# Patient Record
Sex: Female | Born: 1935 | Race: Black or African American | Hispanic: No | Marital: Married | Smoking: Never smoker
Health system: Southern US, Community
[De-identification: ages and names within clinical notes are randomized; demographics above are authoritative.]

## PROBLEM LIST (undated history)

## (undated) DIAGNOSIS — I639 Cerebral infarction, unspecified: Secondary | ICD-10-CM

## (undated) DIAGNOSIS — I2699 Other pulmonary embolism without acute cor pulmonale: Secondary | ICD-10-CM

## (undated) DIAGNOSIS — N289 Disorder of kidney and ureter, unspecified: Secondary | ICD-10-CM

## (undated) DIAGNOSIS — I10 Essential (primary) hypertension: Secondary | ICD-10-CM

## (undated) DIAGNOSIS — E119 Type 2 diabetes mellitus without complications: Secondary | ICD-10-CM

## (undated) HISTORY — DX: Cerebral infarction, unspecified: I63.9

## (undated) HISTORY — DX: Other pulmonary embolism without acute cor pulmonale: I26.99

---

## 1997-12-15 ENCOUNTER — Other Ambulatory Visit: Admission: RE | Admit: 1997-12-15 | Discharge: 1997-12-15 | Payer: Self-pay | Admitting: Family Medicine

## 1999-11-22 ENCOUNTER — Other Ambulatory Visit: Admission: RE | Admit: 1999-11-22 | Discharge: 1999-11-22 | Payer: Self-pay | Admitting: Family Medicine

## 2000-02-27 ENCOUNTER — Other Ambulatory Visit: Admission: RE | Admit: 2000-02-27 | Discharge: 2000-02-27 | Payer: Self-pay | Admitting: Family Medicine

## 2004-03-27 ENCOUNTER — Ambulatory Visit: Payer: Self-pay | Admitting: *Deleted

## 2004-06-18 ENCOUNTER — Ambulatory Visit: Payer: Self-pay | Admitting: Family Medicine

## 2004-07-22 ENCOUNTER — Ambulatory Visit: Payer: Self-pay | Admitting: Family Medicine

## 2004-07-29 ENCOUNTER — Encounter: Admission: RE | Admit: 2004-07-29 | Discharge: 2004-07-29 | Payer: Self-pay | Admitting: Family Medicine

## 2004-08-27 ENCOUNTER — Ambulatory Visit: Payer: Self-pay | Admitting: Family Medicine

## 2004-09-19 ENCOUNTER — Ambulatory Visit: Payer: Self-pay | Admitting: Family Medicine

## 2005-05-12 ENCOUNTER — Ambulatory Visit: Payer: Self-pay | Admitting: Family Medicine

## 2005-09-01 ENCOUNTER — Ambulatory Visit: Payer: Self-pay | Admitting: Family Medicine

## 2006-01-12 ENCOUNTER — Ambulatory Visit: Payer: Self-pay | Admitting: Family Medicine

## 2006-01-21 ENCOUNTER — Ambulatory Visit: Payer: Self-pay | Admitting: Family Medicine

## 2006-01-23 ENCOUNTER — Ambulatory Visit: Payer: Self-pay | Admitting: Family Medicine

## 2006-02-13 ENCOUNTER — Ambulatory Visit (HOSPITAL_COMMUNITY): Admission: RE | Admit: 2006-02-13 | Discharge: 2006-02-13 | Payer: Self-pay | Admitting: Family Medicine

## 2006-02-13 ENCOUNTER — Encounter (INDEPENDENT_AMBULATORY_CARE_PROVIDER_SITE_OTHER): Payer: Self-pay | Admitting: Family Medicine

## 2006-03-05 ENCOUNTER — Ambulatory Visit: Payer: Self-pay | Admitting: Family Medicine

## 2006-05-06 ENCOUNTER — Ambulatory Visit: Payer: Self-pay | Admitting: Family Medicine

## 2006-11-09 ENCOUNTER — Ambulatory Visit: Payer: Self-pay | Admitting: Family Medicine

## 2006-11-11 ENCOUNTER — Ambulatory Visit: Payer: Self-pay | Admitting: Family Medicine

## 2006-12-03 ENCOUNTER — Ambulatory Visit: Payer: Self-pay | Admitting: Family Medicine

## 2007-03-31 ENCOUNTER — Encounter (INDEPENDENT_AMBULATORY_CARE_PROVIDER_SITE_OTHER): Payer: Self-pay | Admitting: Family Medicine

## 2007-03-31 DIAGNOSIS — E785 Hyperlipidemia, unspecified: Secondary | ICD-10-CM | POA: Insufficient documentation

## 2007-03-31 DIAGNOSIS — Z8739 Personal history of other diseases of the musculoskeletal system and connective tissue: Secondary | ICD-10-CM

## 2007-03-31 DIAGNOSIS — I10 Essential (primary) hypertension: Secondary | ICD-10-CM | POA: Insufficient documentation

## 2007-03-31 DIAGNOSIS — N182 Chronic kidney disease, stage 2 (mild): Secondary | ICD-10-CM | POA: Insufficient documentation

## 2007-05-06 ENCOUNTER — Telehealth (INDEPENDENT_AMBULATORY_CARE_PROVIDER_SITE_OTHER): Payer: Self-pay | Admitting: *Deleted

## 2007-05-20 DIAGNOSIS — M810 Age-related osteoporosis without current pathological fracture: Secondary | ICD-10-CM | POA: Insufficient documentation

## 2007-05-20 DIAGNOSIS — K219 Gastro-esophageal reflux disease without esophagitis: Secondary | ICD-10-CM | POA: Insufficient documentation

## 2007-06-08 ENCOUNTER — Ambulatory Visit: Payer: Self-pay | Admitting: Family Medicine

## 2007-06-08 LAB — CONVERTED CEMR LAB
BUN: 24 mg/dL — ABNORMAL HIGH (ref 6–23)
Basophils Absolute: 0 10*3/uL (ref 0.0–0.1)
Blood Glucose, Fingerstick: 122
CO2: 26 meq/L (ref 19–32)
Cholesterol: 152 mg/dL (ref 0–200)
Creatinine, Ser: 1.59 mg/dL — ABNORMAL HIGH (ref 0.40–1.20)
Eosinophils Relative: 3 % (ref 0–5)
Glucose, Bld: 103 mg/dL — ABNORMAL HIGH (ref 70–99)
HCT: 38.5 % (ref 36.0–46.0)
HDL: 47 mg/dL (ref >39)
Hemoglobin: 12.7 g/dL (ref 12.0–15.0)
Lymphocytes Relative: 28 % (ref 12–46)
Lymphs Abs: 1.7 10*3/uL (ref 0.7–4.0)
Monocytes Absolute: 0.4 10*3/uL (ref 0.1–1.0)
Monocytes Relative: 7 % (ref 3–12)
RDW: 12.2 % (ref 11.5–15.5)
Total Bilirubin: 0.6 mg/dL (ref 0.3–1.2)
Total CHOL/HDL Ratio: 3.2
Triglycerides: 103 mg/dL (ref <150)
VLDL: 21 mg/dL (ref 0–40)

## 2007-07-12 ENCOUNTER — Ambulatory Visit (HOSPITAL_COMMUNITY): Admission: RE | Admit: 2007-07-12 | Discharge: 2007-07-12 | Payer: Self-pay | Admitting: Family Medicine

## 2007-09-08 ENCOUNTER — Telehealth (INDEPENDENT_AMBULATORY_CARE_PROVIDER_SITE_OTHER): Payer: Self-pay | Admitting: *Deleted

## 2007-09-14 ENCOUNTER — Encounter (INDEPENDENT_AMBULATORY_CARE_PROVIDER_SITE_OTHER): Payer: Self-pay | Admitting: *Deleted

## 2007-10-28 ENCOUNTER — Telehealth (INDEPENDENT_AMBULATORY_CARE_PROVIDER_SITE_OTHER): Payer: Self-pay | Admitting: Family Medicine

## 2008-02-15 ENCOUNTER — Encounter (INDEPENDENT_AMBULATORY_CARE_PROVIDER_SITE_OTHER): Payer: Self-pay | Admitting: Family Medicine

## 2008-03-07 ENCOUNTER — Telehealth (INDEPENDENT_AMBULATORY_CARE_PROVIDER_SITE_OTHER): Payer: Self-pay | Admitting: Family Medicine

## 2008-04-05 ENCOUNTER — Encounter (INDEPENDENT_AMBULATORY_CARE_PROVIDER_SITE_OTHER): Payer: Self-pay | Admitting: Family Medicine

## 2008-05-09 ENCOUNTER — Ambulatory Visit: Payer: Self-pay | Admitting: Family Medicine

## 2008-05-09 LAB — CONVERTED CEMR LAB
Bilirubin Urine: NEGATIVE
Blood in Urine, dipstick: NEGATIVE
Ketones, urine, test strip: NEGATIVE
Nitrite: NEGATIVE
Urobilinogen, UA: 0.2
pH: 5

## 2008-05-13 LAB — CONVERTED CEMR LAB
AST: 21 units/L (ref 0–37)
Albumin: 4.4 g/dL (ref 3.5–5.2)
BUN: 39 mg/dL — ABNORMAL HIGH (ref 6–23)
Basophils Relative: 1 % (ref 0–1)
Calcium: 9.3 mg/dL (ref 8.4–10.5)
Chloride: 105 meq/L (ref 96–112)
Cholesterol: 163 mg/dL (ref 0–200)
Creatinine, Ser: 1.96 mg/dL — ABNORMAL HIGH (ref 0.40–1.20)
Glucose, Bld: 107 mg/dL — ABNORMAL HIGH (ref 70–99)
HDL: 54 mg/dL (ref >39)
Hemoglobin: 12.8 g/dL (ref 12.0–15.0)
Lymphs Abs: 2.2 10*3/uL (ref 0.7–4.0)
MCHC: 32.7 g/dL (ref 30.0–36.0)
MCV: 99.7 fL (ref 78.0–100.0)
Monocytes Absolute: 0.5 10*3/uL (ref 0.1–1.0)
Monocytes Relative: 7 % (ref 3–12)
Neutro Abs: 4.2 10*3/uL (ref 1.7–7.7)
Potassium: 4.6 meq/L (ref 3.5–5.3)
RBC: 3.93 M/uL (ref 3.87–5.11)
TSH: 1.321 microintl units/mL (ref 0.350–4.50)
Total CHOL/HDL Ratio: 3
Triglycerides: 117 mg/dL (ref <150)
WBC: 7.3 10*3/uL (ref 4.0–10.5)

## 2008-05-17 ENCOUNTER — Ambulatory Visit (HOSPITAL_COMMUNITY): Admission: RE | Admit: 2008-05-17 | Discharge: 2008-05-17 | Payer: Self-pay | Admitting: Family Medicine

## 2008-05-17 ENCOUNTER — Ambulatory Visit: Payer: Self-pay | Admitting: Family Medicine

## 2008-05-20 ENCOUNTER — Telehealth (INDEPENDENT_AMBULATORY_CARE_PROVIDER_SITE_OTHER): Payer: Self-pay | Admitting: *Deleted

## 2008-05-26 ENCOUNTER — Encounter (INDEPENDENT_AMBULATORY_CARE_PROVIDER_SITE_OTHER): Payer: Self-pay | Admitting: Family Medicine

## 2008-05-26 ENCOUNTER — Telehealth (INDEPENDENT_AMBULATORY_CARE_PROVIDER_SITE_OTHER): Payer: Self-pay | Admitting: Family Medicine

## 2008-06-07 ENCOUNTER — Ambulatory Visit: Payer: Self-pay | Admitting: Family Medicine

## 2008-06-07 LAB — CONVERTED CEMR LAB
BUN: 31 mg/dL — ABNORMAL HIGH (ref 6–23)
Chloride: 105 meq/L (ref 96–112)
Potassium: 4.4 meq/L (ref 3.5–5.3)
Sodium: 141 meq/L (ref 135–145)

## 2008-10-04 ENCOUNTER — Encounter (INDEPENDENT_AMBULATORY_CARE_PROVIDER_SITE_OTHER): Payer: Self-pay | Admitting: Family Medicine

## 2008-10-17 ENCOUNTER — Ambulatory Visit: Payer: Self-pay | Admitting: Family Medicine

## 2008-10-17 LAB — CONVERTED CEMR LAB
Blood Glucose, Fingerstick: 87
Hgb A1c MFr Bld: 6.3 %

## 2008-10-23 ENCOUNTER — Ambulatory Visit: Payer: Self-pay | Admitting: Family Medicine

## 2008-10-26 ENCOUNTER — Ambulatory Visit (HOSPITAL_COMMUNITY): Admission: RE | Admit: 2008-10-26 | Discharge: 2008-10-26 | Payer: Self-pay | Admitting: Family Medicine

## 2008-11-01 ENCOUNTER — Encounter (INDEPENDENT_AMBULATORY_CARE_PROVIDER_SITE_OTHER): Payer: Self-pay | Admitting: Family Medicine

## 2008-11-01 DIAGNOSIS — N2581 Secondary hyperparathyroidism of renal origin: Secondary | ICD-10-CM | POA: Insufficient documentation

## 2008-11-24 ENCOUNTER — Encounter (INDEPENDENT_AMBULATORY_CARE_PROVIDER_SITE_OTHER): Payer: Self-pay | Admitting: Family Medicine

## 2009-01-19 ENCOUNTER — Encounter (INDEPENDENT_AMBULATORY_CARE_PROVIDER_SITE_OTHER): Payer: Self-pay | Admitting: Nurse Practitioner

## 2009-01-31 ENCOUNTER — Encounter (INDEPENDENT_AMBULATORY_CARE_PROVIDER_SITE_OTHER): Payer: Self-pay | Admitting: Nurse Practitioner

## 2009-01-31 DIAGNOSIS — I129 Hypertensive chronic kidney disease with stage 1 through stage 4 chronic kidney disease, or unspecified chronic kidney disease: Secondary | ICD-10-CM

## 2009-05-07 ENCOUNTER — Encounter: Payer: Self-pay | Admitting: Physician Assistant

## 2009-05-07 LAB — CONVERTED CEMR LAB
ALT: 20 units/L
AST: 28 units/L
Chloride: 102 meq/L
GFR calc Af Amer: 37 mL/min
Glucose, Bld: 88 mg/dL
HCT: 34.5 %
Hemoglobin: 12 g/dL
MCV: 97 fL
Monocytes Relative: 6 %
PTH: 24 pg/mL
Platelets: 244 10*3/uL
Potassium: 3.7 meq/L
RDW: 12.8 %
Sodium: 141 meq/L
Total Bilirubin: 0.5 mg/dL
Total Protein: 7.7 g/dL

## 2009-05-09 ENCOUNTER — Ambulatory Visit: Payer: Self-pay | Admitting: Internal Medicine

## 2009-05-09 LAB — CONVERTED CEMR LAB
Cholesterol: 148 mg/dL (ref 0–200)
HDL: 45 mg/dL (ref >39)
Total CHOL/HDL Ratio: 3.3
Triglycerides: 99 mg/dL (ref <150)

## 2009-05-14 ENCOUNTER — Telehealth (INDEPENDENT_AMBULATORY_CARE_PROVIDER_SITE_OTHER): Payer: Self-pay | Admitting: Internal Medicine

## 2009-05-27 ENCOUNTER — Encounter: Payer: Self-pay | Admitting: Physician Assistant

## 2009-06-06 ENCOUNTER — Ambulatory Visit: Payer: Self-pay | Admitting: Physician Assistant

## 2009-06-06 DIAGNOSIS — E119 Type 2 diabetes mellitus without complications: Secondary | ICD-10-CM | POA: Insufficient documentation

## 2009-06-06 LAB — CONVERTED CEMR LAB
HDL: 42 mg/dL (ref >39)
LDL Cholesterol: 82 mg/dL (ref 0–99)
Triglycerides: 106 mg/dL (ref <150)
VLDL: 21 mg/dL (ref 0–40)

## 2009-06-11 ENCOUNTER — Encounter: Payer: Self-pay | Admitting: Physician Assistant

## 2009-09-04 ENCOUNTER — Ambulatory Visit: Payer: Self-pay | Admitting: Physician Assistant

## 2009-09-04 LAB — CONVERTED CEMR LAB
Alkaline Phosphatase: 82 units/L (ref 39–117)
BUN: 29 mg/dL — ABNORMAL HIGH (ref 6–23)
Blood Glucose, Fingerstick: 86
Creatinine, Ser: 1.55 mg/dL — ABNORMAL HIGH (ref 0.40–1.20)
Glucose, Bld: 88 mg/dL (ref 70–99)
Total Bilirubin: 0.6 mg/dL (ref 0.3–1.2)

## 2009-09-06 ENCOUNTER — Encounter: Payer: Self-pay | Admitting: Physician Assistant

## 2009-10-09 ENCOUNTER — Encounter: Payer: Self-pay | Admitting: Physician Assistant

## 2009-10-09 ENCOUNTER — Ambulatory Visit: Payer: Self-pay | Admitting: Internal Medicine

## 2009-10-09 DIAGNOSIS — L659 Nonscarring hair loss, unspecified: Secondary | ICD-10-CM | POA: Insufficient documentation

## 2009-10-09 LAB — CONVERTED CEMR LAB: TSH: 1.276 microintl units/mL (ref 0.350–4.500)

## 2009-10-10 ENCOUNTER — Encounter: Payer: Self-pay | Admitting: Physician Assistant

## 2009-10-17 ENCOUNTER — Encounter: Payer: Self-pay | Admitting: Physician Assistant

## 2009-10-28 ENCOUNTER — Encounter: Payer: Self-pay | Admitting: Physician Assistant

## 2010-01-01 ENCOUNTER — Telehealth: Payer: Self-pay | Admitting: Physician Assistant

## 2010-03-01 ENCOUNTER — Ambulatory Visit: Payer: Self-pay | Admitting: Physician Assistant

## 2010-03-01 LAB — CONVERTED CEMR LAB
Blood Glucose, Fingerstick: 105
Hgb A1c MFr Bld: 6.3 %

## 2010-03-03 LAB — CONVERTED CEMR LAB
ALT: 18 units/L (ref 0–35)
AST: 25 units/L (ref 0–37)
Albumin: 4.2 g/dL (ref 3.5–5.2)
Alkaline Phosphatase: 77 units/L (ref 39–117)
BUN: 39 mg/dL — ABNORMAL HIGH (ref 6–23)
Potassium: 4.6 meq/L (ref 3.5–5.3)
Sodium: 143 meq/L (ref 135–145)
Total Protein: 7.1 g/dL (ref 6.0–8.3)

## 2010-03-07 ENCOUNTER — Ambulatory Visit (HOSPITAL_COMMUNITY): Admission: RE | Admit: 2010-03-07 | Discharge: 2010-03-07 | Payer: Self-pay | Admitting: Internal Medicine

## 2010-03-07 DIAGNOSIS — R928 Other abnormal and inconclusive findings on diagnostic imaging of breast: Secondary | ICD-10-CM | POA: Insufficient documentation

## 2010-03-13 ENCOUNTER — Encounter: Payer: Self-pay | Admitting: Physician Assistant

## 2010-03-13 ENCOUNTER — Encounter: Admission: RE | Admit: 2010-03-13 | Discharge: 2010-03-13 | Payer: Self-pay | Admitting: Internal Medicine

## 2010-03-20 ENCOUNTER — Ambulatory Visit: Payer: Self-pay | Admitting: Physician Assistant

## 2010-03-21 LAB — CONVERTED CEMR LAB
BUN: 30 mg/dL — ABNORMAL HIGH (ref 6–23)
CO2: 27 meq/L (ref 19–32)
Calcium: 9.6 mg/dL (ref 8.4–10.5)
Chloride: 107 meq/L (ref 96–112)
Creatinine, Ser: 1.76 mg/dL — ABNORMAL HIGH (ref 0.40–1.20)
Glucose, Bld: 106 mg/dL — ABNORMAL HIGH (ref 70–99)
Potassium: 4.9 meq/L (ref 3.5–5.3)
Sodium: 143 meq/L (ref 135–145)

## 2010-04-25 ENCOUNTER — Encounter (INDEPENDENT_AMBULATORY_CARE_PROVIDER_SITE_OTHER): Payer: Self-pay | Admitting: Nurse Practitioner

## 2010-06-25 ENCOUNTER — Ambulatory Visit: Payer: Self-pay | Admitting: Internal Medicine

## 2010-06-25 DIAGNOSIS — R252 Cramp and spasm: Secondary | ICD-10-CM

## 2010-06-25 LAB — CONVERTED CEMR LAB: Blood Glucose, Fingerstick: 126

## 2010-07-09 ENCOUNTER — Ambulatory Visit: Admit: 2010-07-09 | Payer: Self-pay | Admitting: Internal Medicine

## 2010-07-12 ENCOUNTER — Encounter (INDEPENDENT_AMBULATORY_CARE_PROVIDER_SITE_OTHER): Payer: Self-pay | Admitting: Internal Medicine

## 2010-07-12 ENCOUNTER — Ambulatory Visit
Admission: RE | Admit: 2010-07-12 | Discharge: 2010-07-12 | Payer: Self-pay | Source: Home / Self Care | Attending: Internal Medicine | Admitting: Internal Medicine

## 2010-07-12 LAB — CONVERTED CEMR LAB
AST: 24 units/L (ref 0–37)
BUN: 24 mg/dL — ABNORMAL HIGH (ref 6–23)
Calcium: 9.1 mg/dL (ref 8.4–10.5)
Chloride: 105 meq/L (ref 96–112)
Cholesterol: 142 mg/dL (ref 0–200)
Creatinine, Ser: 1.71 mg/dL — ABNORMAL HIGH (ref 0.40–1.20)
HDL: 47 mg/dL (ref >39)
Total CHOL/HDL Ratio: 3

## 2010-07-29 ENCOUNTER — Encounter (INDEPENDENT_AMBULATORY_CARE_PROVIDER_SITE_OTHER): Payer: Self-pay | Admitting: Internal Medicine

## 2010-08-04 LAB — CONVERTED CEMR LAB
Creatinine 24 HR UR: 1019 mg/24hr (ref 700–1800)
Creatinine Clearance: 43 mL/min — ABNORMAL LOW (ref 75–115)
Protein, Ur: 30 mg/24hr — ABNORMAL LOW (ref 50–100)

## 2010-08-08 NOTE — Letter (Signed)
Summary: COLO REPORT  COLO REPORT   Imported By: Arta Bruce 10/30/2009 13:01:22  _____________________________________________________________________  External Attachment:    Type:   Image     Comment:   External Document

## 2010-08-08 NOTE — Letter (Signed)
Summary: *HSN Results Follow up  [facility]  [address] [place], [place] [address]   Phone: [phone]  Fax: [phone]      10/10/2009   Amber Gonzales [address] [place], [place]  [address]   Dear  Ms. [name],                            ____S.Drinkard,FNP   ____D. Gore,FNP       ____B. McPherson,MD   ____V. Rankins,MD    ____E. Mulberry,MD    ____N. Daphine Deutscher, FNP  ____D. Reche Dixon, MD    ____K. Philipp Deputy, MD    __x_S. Alben Spittle, PA-C     This letter is to inform you that your recent test(s):  _______Pap Smear    ___x___Lab Test     _______X-ray    ___x____ is within acceptable limits  _______ requires a medication change  _______ requires a follow-up lab visit  _______ requires a follow-up visit with your provider   Comments:       _________________________________________________________ If you have any questions, please contact our office                     Sincerely,  Tereso Newcomer PA-C [facility]

## 2010-08-08 NOTE — Miscellaneous (Signed)
Summary: Colonoscopy . . . Marland Kitchen needs repeat in 2016  Clinical Lists Changes  Observations: Added new observation of PAST MED HX: Current Problems:  AODM (ICD-250.00) GERD (ICD-530.81) OSTEOPOROSIS (ICD-733.00) SHOULDER PAIN, RIGHT, HX OF (ICD-V13.5) RENAL DISEASE, CHRONIC, MILD (ICD-585.2)   a.  CKD Stage 4 (followed by Dr. Darrick Penna) due to HTN Nephrosclerosis Secondary Hyperparathyroidism HYPERLIPIDEMIA (ICD-272.4) HYPERTENSION (ICD-401.9) Colo 2011: Polyps; bx inconclusive:  Repeat colo in 2016     (10/28/2009 22:29) Added new observation of COLONRECACT: Further recommendations pending biopsy results.  "Fecal Material . . . nondiagnostic"  No colonic mucosa identified Repeat colonoscopy in 5 years.  (10/17/2009 22:31) Added new observation of COLONOSCOPY:  Results: Polyp.  Results: Diverticulosis.        (10/17/2009 22:31)      Colonoscopy  Procedure date:  10/17/2009  Findings:       Results: Polyp.  Results: Diverticulosis.         Comments:      Further recommendations pending biopsy results.  'Fecal Material . . . nondiagnostic'  No colonic mucosa identified Repeat colonoscopy in 5 years.     Past History:  Past Medical History: Current Problems:  AODM (ICD-250.00) GERD (ICD-530.81) OSTEOPOROSIS (ICD-733.00) SHOULDER PAIN, RIGHT, HX OF (ICD-V13.5) RENAL DISEASE, CHRONIC, MILD (ICD-585.2)   a.  CKD Stage 4 (followed by Dr. Darrick Penna) due to HTN Nephrosclerosis Secondary Hyperparathyroidism HYPERLIPIDEMIA (ICD-272.4) HYPERTENSION (ICD-401.9) Colo 2011: Polyps; bx inconclusive:  Repeat colo in 2016

## 2010-08-08 NOTE — Progress Notes (Signed)
Summary: LOVASTATIN REFERRAL   Phone Note Call from Patient   Caller: Patient Reason for Call: Refill Medication Summary of Call: [facility] [place] FOR HER LOVASTATIN 40MG  AND THEY TOLD HER THAT SHE NEEDS TO CALL HER DR .  Initial call taken by: Cheryll Dessert,  January 01, 2010 2:43 PM  Follow-up for Phone Call        forward to provider Follow-up by: Armenia Shannon,  January 01, 2010 3:16 PM  Additional Follow-up for Phone Call Additional follow up Details #1::        pt aware Additional Follow-up by: Armenia Shannon,  January 01, 2010 3:43 PM    Prescriptions: LOVASTATIN 40 MG  TABS (LOVASTATIN) Take 1 tab by mouth at bedtime  #30 x 11   Entered and Authorized by:   Tereso Newcomer PA-C   Signed by:   Tereso Newcomer PA-C on 01/01/2010   Method used:   [facility]  [address] [phone]       [address]       [place], [place]  [address]       Ph: [phone] or [phone]       Fax: [phone]   RxID:   [id number]

## 2010-08-08 NOTE — Assessment & Plan Note (Signed)
Summary: FU IN 3 MONTHS WITH SCOTT FOR DM AND BP//GK   Vital Signs:  Patient profile:   77 year old female Menstrual status:  postmenopausal Height:      62 inches Weight:      163 pounds BMI:     29.92 Temp:     97.6 degrees F oral Pulse rate:   76 / minute Pulse rhythm:   regular Resp:     18 per minute BP sitting:   138 / 78  (left arm) Cuff size:   regular  Vitals Entered By: Armenia Shannon (March 01, 2010 9:20 AM) CC: f/u...., Hypertension Management Is Patient Diabetic? Yes Pain Assessment Patient in pain? no      CBG Result 105  Does patient need assistance? Functional Status Self care Ambulation Normal   Primary Care Provider:  Tereso Newcomer PA-C  CC:  f/u.... and Hypertension Management.  History of Present Illness: Here for f/u. No complaints. Denies any nausea, vomiting or diarrhea. Follows a pretty good diet. Had colo.  Needs mammo.  Needs CPE.  Hypertension History:      She denies headache, chest pain, dyspnea with exertion, peripheral edema, and syncope.  She notes no problems with any antihypertensive medication side effects.        Positive major cardiovascular risk factors include female age 51 years old or older, diabetes, hyperlipidemia, and hypertension.  Negative major cardiovascular risk factors include negative family history for ischemic heart disease and non-tobacco-user status.     Current Medications (verified): 1)  Lovastatin 40 Mg  Tabs (Lovastatin) .... Take 1 Tab By Mouth At Bedtime 2)  Triamterene-Hctz 75-50 Mg  Tabs (Triamterene-Hctz) .... 1/2 Tablet By Mouth Daily For Blood Pressure/fluid 3)  Atenolol 50 Mg  Tabs (Atenolol) .Marland Kitchen.. 1 Tablet By Mouth Daily For Blood Presure 4)  Adult Aspirin Low Strength 81 Mg  Tbdp (Aspirin) .... Take 1 By Mouth Daily 5)  Benazepril Hcl 40 Mg Tabs (Benazepril Hcl) .... Take 1 Tablet By Mouth Two Times A Day (Increased Per Dr.deterding) 6)  Calcitriol 0.5 Mcg Caps (Calcitriol) .... Take 1 Capsule By  Mouth Once A Day(Per Dr.deterding) 7)  Norvasc 5 Mg Tabs (Amlodipine Besylate) .... Take 1 Tablet By Mouth Once A Day For Blood Pressure  Allergies (verified): No Known Drug Allergies  Past History:  Past Medical History: Reviewed history from 10/28/2009 and no changes required. Current Problems:  AODM (ICD-250.00) GERD (ICD-530.81) OSTEOPOROSIS (ICD-733.00) SHOULDER PAIN, RIGHT, HX OF (ICD-V13.5) RENAL DISEASE, CHRONIC, MILD (ICD-585.2)   a.  CKD Stage 4 (followed by Dr. Darrick Penna) due to HTN Nephrosclerosis Secondary Hyperparathyroidism HYPERLIPIDEMIA (ICD-272.4) HYPERTENSION (ICD-401.9) Colo 2011: Polyps; bx inconclusive:  Repeat colo in 2016  Physical Exam  General:  alert, well-developed, and well-nourished.   Head:  normocephalic and atraumatic.   Neck:  supple.   Lungs:  normal breath sounds.   Heart:  normal rate and regular rhythm.   Abdomen:  soft.   Extremities:  no edema  Neurologic:  alert & oriented X3 and cranial nerves II-XII intact.   Psych:  normally interactive.     Impression & Recommendations:  Problem # 1:  DIABETES MELLITUS, TYPE II (ICD-250.00) controlled with diet  Her updated medication list for this problem includes:    Adult Aspirin Low Strength 81 Mg Tbdp (Aspirin) .Marland Kitchen... Take 1 by mouth daily    Benazepril Hcl 40 Mg Tabs (Benazepril hcl) .Marland Kitchen... Take 1 tablet by mouth two times a day (increased per dr.deterding)  Orders:  Capillary Blood Glucose/CBG (82948) Hemoglobin A1C (98119) T-Comprehensive Metabolic Panel ([address]782-95621) T-Urine Microalbumin w/creat. ratio [phone])  Problem # 2:  HYPERTENSION (ICD-401.9) usually well controlled ate Taco Salad last night continue current meds for now  Her updated medication list for this problem includes:    Triamterene-hctz 75-50 Mg Tabs (Triamterene-hctz) .Marland Kitchen... 1/2 tablet by mouth daily for blood pressure/fluid    Atenolol 50 Mg Tabs (Atenolol) .Marland Kitchen... 1 tablet by mouth daily for blood  presure    Benazepril Hcl 40 Mg Tabs (Benazepril hcl) .Marland Kitchen... Take 1 tablet by mouth two times a day (increased per dr.deterding)    Norvasc 5 Mg Tabs (Amlodipine besylate) .Marland Kitchen... Take 1 tablet by mouth once a day for blood pressure  Orders: T-Comprehensive Metabolic Panel (28413-24401)  Problem # 3:  HYPERLIPIDEMIA (ICD-272.4)  Her updated medication list for this problem includes:    Lovastatin 40 Mg Tabs (Lovastatin) .Marland Kitchen... Take 1 tab by mouth at bedtime  Orders: T-Comprehensive Metabolic Panel (02725-36644)  Problem # 4:  PREVENTIVE HEALTH CARE (ICD-V70.0) schedule CPE  Orders: Mammogram (Screening) (Mammo)  Complete Medication List: 1)  Lovastatin 40 Mg Tabs (Lovastatin) .... Take 1 tab by mouth at bedtime 2)  Triamterene-hctz 75-50 Mg Tabs (Triamterene-hctz) .... 1/2 tablet by mouth daily for blood pressure/fluid 3)  Atenolol 50 Mg Tabs (Atenolol) .Marland Kitchen.. 1 tablet by mouth daily for blood presure 4)  Adult Aspirin Low Strength 81 Mg Tbdp (Aspirin) .... Take 1 by mouth daily 5)  Benazepril Hcl 40 Mg Tabs (Benazepril hcl) .... Take 1 tablet by mouth two times a day (increased per dr.deterding) 6)  Calcitriol 0.5 Mcg Caps (Calcitriol) .... Take 1 capsule by mouth once a day(per dr.deterding) 7)  Norvasc 5 Mg Tabs (Amlodipine besylate) .... Take 1 tablet by mouth once a day for blood pressure  Hypertension Assessment/Plan:      The patient's hypertensive risk group is category C: Target organ damage and/or diabetes.  Her calculated 10 year risk of coronary heart disease is 17 %.  Today's blood pressure is 138/78.  Her blood pressure goal is < 125/75.  Patient Instructions: 1)  Please schedule a follow-up appointment in 3 months with Scott for CPE. 2)     Laboratory Results   Blood Tests     HGBA1C: 6.3%   (Normal Range: Non-Diabetic - 3-6%   Control Diabetic - 6-8%) CBG Random:: 105mg /dL

## 2010-08-08 NOTE — Letter (Signed)
Summary: TEST ORDER FORM//MAMMOGRAM  TEST ORDER FORM//MAMMOGRAM   Imported By: Arta Bruce 03/01/2010 10:34:17  _____________________________________________________________________  External Attachment:    Type:   Image     Comment:   External Document

## 2010-08-08 NOTE — Assessment & Plan Note (Signed)
Summary: 2236 PT//3 MO F/U/////RJE//RES.SS   Vital Signs:  Patient profile:   77 year old female Menstrual status:  postmenopausal Weight:      163.31 pounds Temp:     97 degrees F oral Pulse rate:   60 / minute Pulse rhythm:   regular Resp:     16 per minute BP sitting:   144 / 70  (right arm) Cuff size:   large  Vitals Entered By: Hale Drone CMA (June 25, 2010 11:21 AM) CC: 3 month f/u from 03/01/10.  CBG Result 126 CBG Device ID A Non Fasting   Primary Care Provider:  Tereso Newcomer PA-C  CC:  3 month f/u from 03/01/10. Marland Kitchen  History of Present Illness: 1.  DM:  Did get flu vaccine with Dr. Darrick Penna.  A1C today 6.6%.  Sugars generally in low 100s.  Has not had eyes checked this year.  Goes to Dr. ?Sethi for eye exams.  2.  Hypertension:  checks bp at home and always less than when comes here, her bp is up.  Pt. states 126/67 this morning at home.  She states this is generally where she hangs out with her blood pressure at home.    3. Hypercholesterolemia:  has not had checked recently.  Nonfasting today.    4.  Right leg pain:  painful knotting up of right shin when lying down at night.  Rare, but can be very painful.  Worse ofter on feet a lot.    Current Medications (verified): 1)  Lovastatin 40 Mg  Tabs (Lovastatin) .... Take 1 Tab By Mouth At Bedtime 2)  Triamterene-Hctz 75-50 Mg  Tabs (Triamterene-Hctz) .... 1/2 Tablet By Mouth Daily For Blood Pressure/fluid 3)  Atenolol 50 Mg  Tabs (Atenolol) .Marland Kitchen.. 1 Tablet By Mouth Daily For Blood Presure 4)  Adult Aspirin Low Strength 81 Mg  Tbdp (Aspirin) .... Take 1 By Mouth Daily 5)  Benazepril Hcl 40 Mg Tabs (Benazepril Hcl) .... Take 1 Tablet By Mouth Two Times A Day (Increased Per Dr.deterding) 6)  Calcitriol 0.5 Mcg Caps (Calcitriol) .... Take 1 Capsule By Mouth Once A Day(Per Dr.deterding) 7)  Norvasc 5 Mg Tabs (Amlodipine Besylate) .... Take 1 Tablet By Mouth Once A Day For Blood Pressure  Allergies (verified): No Known  Drug Allergies  Physical Exam  General:  NAD Lungs:  Normal respiratory effort, chest expands symmetrically. Lungs are clear to auscultation, no crackles or wheezes. Heart:  Normal rate and regular rhythm. S1 and S2 normal without gallop, murmur, click, rub or other extra sounds.  Radial pulses normal and equal Extremities:  Trace edema, varicosities of LE bilateral.  NT on palpation of pretib area today.   Impression & Recommendations:  Problem # 1:  DIABETES MELLITUS, TYPE II (ICD-250.00) Controlled Encourage to get her yearly eye check Her updated medication list for this problem includes:    Adult Aspirin Low Strength 81 Mg Tbdp (Aspirin) .Marland Kitchen... Take 1 by mouth daily    Benazepril Hcl 40 Mg Tabs (Benazepril hcl) .Marland Kitchen... Take 1 tablet by mouth two times a day (increased per dr.deterding)  Orders: Capillary Blood Glucose/CBG (82948) Hgb A1C ([address]UE)  Problem # 2:  HYPERTENSION (ICD-401.9) Pt. gives hx of good bps at home--have her bring in cuff at next visit and keep list of bps done at home. Her updated medication list for this problem includes:    Furosemide 20 Mg Tabs (Furosemide) .Marland Kitchen... 1 tab by mouth daily in morning    Atenolol 50 Mg  Tabs (Atenolol) .Marland Kitchen... 1 tablet by mouth daily for blood presure    Benazepril Hcl 40 Mg Tabs (Benazepril hcl) .Marland Kitchen... Take 1 tablet by mouth two times a day (increased per dr.deterding)    Norvasc 5 Mg Tabs (Amlodipine besylate) .Marland Kitchen... Take 1 tablet by mouth once a day for blood pressure  Problem # 3:  HYPERLIPIDEMIA (ICD-272.4) Nonfasting today--return for blood work Her updated medication list for this problem includes:    Lovastatin 40 Mg Tabs (Lovastatin) .Marland Kitchen... Take 1 tab by mouth at bedtime  Problem # 4:  LEG CRAMPS (ICD-729.82) Stop Dyazide--switch to Furosemide and see if helps To drink more water Recheck Electrolytes and bp in 2 weeks with labs  Complete Medication List: 1)  Lovastatin 40 Mg Tabs (Lovastatin) .... Take 1 tab by mouth  at bedtime 2)  Furosemide 20 Mg Tabs (Furosemide) .Marland Kitchen.. 1 tab by mouth daily in morning 3)  Atenolol 50 Mg Tabs (Atenolol) .Marland Kitchen.. 1 tablet by mouth daily for blood presure 4)  Adult Aspirin Low Strength 81 Mg Tbdp (Aspirin) .... Take 1 by mouth daily 5)  Benazepril Hcl 40 Mg Tabs (Benazepril hcl) .... Take 1 tablet by mouth two times a day (increased per dr.deterding) 6)  Calcitriol 0.5 Mcg Caps (Calcitriol) .... Take 1 capsule by mouth once a day(per dr.deterding) 7)  Norvasc 5 Mg Tabs (Amlodipine besylate) .... Take 1 tablet by mouth once a day for blood pressure  Patient Instructions: 1)  Schedule for FLP, CMET inn 2 weeks with bp checks 2)  Follow up with Dr. Delrae Alfred in 3 months -bp and muscle cramping 3)  . Prescriptions: NORVASC 5 MG TABS (AMLODIPINE BESYLATE) Take 1 tablet by mouth once a day for blood pressure  #30 x 11   Entered and Authorized by:   Julieanne Manson MD   Signed by:   Julieanne Manson MD on 06/25/2010   Method used:   [facility]        [facility] [phone]* (retail)       [address]       [place], [place]  [address]       Ph: [phone] or [phone]       Fax: [phone]   RxID:   [id number] BENAZEPRIL HCL 40 MG TABS (BENAZEPRIL HCL) Take 1 tablet by mouth two times a day (increased per Dr.Deterding)  #60 x 11   Entered and Authorized by:   Julieanne Manson MD   Signed by:   Julieanne Manson MD on 06/25/2010   Method used:   [facility] to        [facility] [address] [phone]* (retail)       [address]       [place], [place]  [address]       Ph: [phone] or [phone]       Fax: [phone]   RxID:   [id number] ATENOLOL 50 MG  TABS (ATENOLOL) 1 tablet by mouth daily for blood presure  #30 Tablet x 11   Entered and Authorized by:   Julieanne Manson MD   Signed by:   Julieanne Manson MD on 06/25/2010   Method used:   [facility] to        [facility]   [address] [phone]* (retail)       [address]       [place], [place]  [address]       Ph: [phone] or [phone]       Fax: [phone]   RxID:   [id number] LOVASTATIN 40 MG  TABS (LOVASTATIN) Take 1 tab by mouth at bedtime  #30 x 11   Entered and Authorized by:   Julieanne Manson MD   Signed by:   Julieanne Manson MD on 06/25/2010   Method used:   Elect[facility]ically to        [facility] [address] [phone]* (retail)       [address]       [place], [place]  [address]       Ph: [phone] or [phone]       Fax: [phone]   RxID:   [id number] FUROSEMIDE 20 MG TABS (FUROSEMIDE) 1 tab by mouth daily in morning  #30 x 11   Entered and Authorized by:   Julieanne Manson MD   Signed by:   Julieanne Manson MD on 06/25/2010   Method used:   [facility]ronically to        [facility] [address] [phone]* (retail)       [address]       [place], [place]  [address]       Ph: [phone] or [phone]       Fax: [phone]   RxID:   [id number]    Orders Added: 1)  Capillary Blood Glucose/CBG [82948] 2)  Hgb A1C [83036QW] 3)  Est. Patient Level IV [73419]   Immunization History:  Influenza Immunization History:    Influenza:  fluvax 3+ (03/07/2010)   Immunization History:  Influenza Immunization History:    Influenza:  Fluvax 3+ (03/07/2010)   Laboratory Results   Blood Tests   Date/Time Received: June 25, 2010 11:44 AM   HGBA1C: 6.6%   (Normal Range: Non-Diabetic - 3-6%   Control Diabetic - 6-8%) CBG Random:: 126mg /dL

## 2010-08-08 NOTE — Assessment & Plan Note (Signed)
Summary: bp check cmet, and flp///cns  Nurse Visit   Vital Signs:  Patient profile:   77 year old female Menstrual status:  postmenopausal Pulse rate:   52 / minute BP sitting:   140 / 76  (right arm) Cuff size:   regular  Vitals Entered By: Gaylyn Cheers RN (July 12, 2010 10:18 AM)  Primary Care Provider:  Tereso Newcomer PA-C   History of Present Illness: Denies SOB, CP, dizziness no ankle swelling, no leg cramps. Taking all meds as prescribed with no problems. Pt. brought her BP machine in to be checked BP 171/86 P 52. Discussed changing batteries, if still having problems may need to replace has had for a long time. Discussed BP with Dr. Delrae Alfred.    Patient Instructions: 1)  Continue all medications as prescribed 2)  Changes in BP notifiy office, may have checked @ Fire Dept. or Drug store. 3)  Keep scheduled appointment in March   Allergies: No Known Drug Allergies  Orders Added: 1)  Est. Patient Level I [[address]] 2)  T-Comprehensive Metabolic Panel [80053-22900] 3)  T-Lipid Profile [60454-09811]

## 2010-08-08 NOTE — Letter (Signed)
Summary: [facility]/PATIENT NOTE  [facility]/PATIENT NOTE   Imported By: Arta Bruce 07/12/2009 15:52:14  _____________________________________________________________________  External Attachment:    Type:   Image     Comment:   External Document

## 2010-08-08 NOTE — Letter (Signed)
Summary: [facility]  [facility]   Imported By: Arta Bruce 06/06/2010 09:00:18  _____________________________________________________________________  External Attachment:    Type:   Image     Comment:   External Document

## 2010-08-08 NOTE — Assessment & Plan Note (Signed)
Summary: 21month f/u////cns   Vital Signs:  Patient profile:   77 year old female Menstrual status:  postmenopausal Height:      62 inches Weight:      163 pounds Temp:     97.6 degrees F oral Pulse rate:   53 / minute Pulse rhythm:   regular Resp:     18 per minute BP sitting:   148 / 81  (left arm) Cuff size:   regular  Vitals Entered By: Armenia Shannon (September 04, 2009 11:44 AM) CC: three month f/u....., Hypertension Management Is Patient Diabetic? Yes Pain Assessment Patient in pain? no      CBG Result 86  Does patient need assistance? Functional Status Self care Ambulation Normal   CC:  three month f/u..... and Hypertension Management.  History of Present Illness: Here for f/u.  CKD:  Still seeing Dr. Darrick Penna.  No AV fistula.  No c/o nausea or confusion.  HTN:  Stil taking meds.  See below.  Had some left shoulder pain a couple weeks ago while doing housework.  Lasted about 5 mins.  No substernal chest pain.  No assoc sob or nausea or diaph.  No jaw pain.  She has some shoulder pain when she wakes up in the am.  She changes position of her arm and it feels better.  No exertional chest pain or sob.  No syncope.  No orhtopnea, pnd or edema.  DM:  Sugars at home 120s.  Still following diet only.  Has eye dr.  No foot problems.  Hypertension History:      She denies headache, dyspnea with exertion, orthopnea, PND, peripheral edema, and syncope.  She notes no problems with any antihypertensive medication side effects.        Positive major cardiovascular risk factors include female age 75 years old or older, diabetes, hyperlipidemia, and hypertension.  Negative major cardiovascular risk factors include negative family history for ischemic heart disease and non-tobacco-user status.     Problems Prior to Update: 1)  Preventive Health Care  (ICD-V70.0) 2)  Nephrosclerosis  (ICD-403.90) 3)  Secondary Hyperparathyroidism  (ICD-588.81) 4)  Screening Mammography, Not High  Risk  (ICD-V76.12) 5)  Diabetes Mellitus, Type II  (ICD-250.00) 6)  Gerd  (ICD-530.81) 7)  Osteoporosis  (ICD-733.00) 8)  Shoulder Pain, Right, Hx of  (ICD-V13.5) 9)  Renal Disease, Chronic, Mild  (ICD-585.2) 10)  Hyperlipidemia  (ICD-272.4) 11)  Hypertension  (ICD-401.9)  Current Medications (verified): 1)  Lovastatin 40 Mg  Tabs (Lovastatin) .... Take 1 Tab By Mouth At Bedtime 2)  Triamterene-Hctz 75-50 Mg  Tabs (Triamterene-Hctz) .... 1/2 Tablet By Mouth Daily For Blood Pressure/fluid 3)  Atenolol 50 Mg  Tabs (Atenolol) .Marland Kitchen.. 1 Tablet By Mouth Daily For Blood Presure 4)  Adult Aspirin Low Strength 81 Mg  Tbdp (Aspirin) .... Take 1 By Mouth Daily 5)  Benazepril Hcl 40 Mg Tabs (Benazepril Hcl) .... Take 1 Tablet By Mouth Two Times A Day (Increased Per Dr.deterding) 6)  Calcitriol 0.5 Mcg Caps (Calcitriol) .... Take 1 Capsule By Mouth Once A Day(Per Dr.deterding)  Allergies (verified): No Known Drug Allergies  Past History:  Past Medical History: Last updated: 05/27/2009 Current Problems:  AODM (ICD-250.00) GERD (ICD-530.81) OSTEOPOROSIS (ICD-733.00) SHOULDER PAIN, RIGHT, HX OF (ICD-V13.5) RENAL DISEASE, CHRONIC, MILD (ICD-585.2)   a.  CKD Stage 4 (followed by Dr. Darrick Penna) due to HTN Nephrosclerosis Secondary Hyperparathyroidism HYPERLIPIDEMIA (ICD-272.4) HYPERTENSION (ICD-401.9)  Physical Exam  General:  alert, well-developed, and well-nourished.   Head:  normocephalic and atraumatic.   Neck:  supple.   Lungs:  normal breath sounds.   Heart:  normal rate and regular rhythm.   Abdomen:  soft.   Msk:  no foot callus or ulcer bilat  Pulses:  DP/PT 2+ bilat  Extremities:  no edema  Neurologic:  alert & oriented X3 and cranial nerves II-XII intact.   Psych:  normally interactive.     Impression & Recommendations:  Problem # 1:  HYPERLIPIDEMIA (ICD-272.4)  LDL < 100 in Dec check cmet  Her updated medication list for this problem includes:    Lovastatin 40 Mg  Tabs (Lovastatin) .Marland Kitchen... Take 1 tab by mouth at bedtime  Orders: T-Comprehensive Metabolic Panel ([id number])  Problem # 2:  DIABETES MELLITUS, TYPE II (ICD-250.00)  Her updated medication list for this problem includes:    Adult Aspirin Low Strength 81 Mg Tbdp (Aspirin) .Marland Kitchen... Take 1 by mouth daily    Benazepril Hcl 40 Mg Tabs (Benazepril hcl) .Marland Kitchen... Take 1 tablet by mouth two times a day (increased per dr.deterding)  Orders: T-Comprehensive Metabolic Panel ([id number])  Problem # 3:  HYPERTENSION (ICD-401.9) uncontrolled start norvasc 5 f/u with me in one month  Her updated medication list for this problem includes:    Triamterene-hctz 75-50 Mg Tabs (Triamterene-hctz) .Marland Kitchen... 1/2 tablet by mouth daily for blood pressure/fluid    Atenolol 50 Mg Tabs (Atenolol) .Marland Kitchen... 1 tablet by mouth daily for blood presure    Benazepril Hcl 40 Mg Tabs (Benazepril hcl) .Marland Kitchen... Take 1 tablet by mouth two times a day (increased per dr.deterding)    Norvasc 5 Mg Tabs (Amlodipine besylate) .Marland Kitchen... Take 1 tablet by mouth once a day for blood pressure  Orders: T-Comprehensive Metabolic Panel ([id number])  Problem # 4:  NEPHROSCLEROSIS (ICD-403.90) f/u with nephrology  Her updated medication list for this problem includes:    Triamterene-hctz 75-50 Mg Tabs (Triamterene-hctz) .Marland Kitchen... 1/2 tablet by mouth daily for blood pressure/fluid    Atenolol 50 Mg Tabs (Atenolol) .Marland Kitchen... 1 tablet by mouth daily for blood presure    Benazepril Hcl 40 Mg Tabs (Benazepril hcl) .Marland Kitchen... Take 1 tablet by mouth two times a day (increased per dr.deterding)    Norvasc 5 Mg Tabs (Amlodipine besylate) .Marland Kitchen... Take 1 tablet by mouth once a day for blood pressure  Problem # 5:  Preventive Health Care (ICD-V70.0)  no h/o colo . . . patient would like to schedule now vaccines up to date mammo due in April she had osteopenia on DEXA in 05/2008; cannot find where Dr. Barbaraann Barthel d/w her (recheck later this year)  Orders: Gastroenterology  Referral (GI)  Complete Medication List: 1)  Lovastatin 40 Mg Tabs (Lovastatin) .... Take 1 tab by mouth at bedtime 2)  Triamterene-hctz 75-50 Mg Tabs (Triamterene-hctz) .... 1/2 tablet by mouth daily for blood pressure/fluid 3)  Atenolol 50 Mg Tabs (Atenolol) .Marland Kitchen.. 1 tablet by mouth daily for blood presure 4)  Adult Aspirin Low Strength 81 Mg Tbdp (Aspirin) .... Take 1 by mouth daily 5)  Benazepril Hcl 40 Mg Tabs (Benazepril hcl) .... Take 1 tablet by mouth two times a day (increased per dr.deterding) 6)  Calcitriol 0.5 Mcg Caps (Calcitriol) .... Take 1 capsule by mouth once a day(per dr.deterding) 7)  Norvasc 5 Mg Tabs (Amlodipine besylate) .... Take 1 tablet by mouth once a day for blood pressure  Hypertension Assessment/Plan:      The patient's hypertensive risk group is category C: Target organ damage and/or diabetes.  Her calculated 10 year  risk of coronary heart disease is 24 %.  Today's blood pressure is 148/81.  Her blood pressure goal is < 125/75.  Patient Instructions: 1)  Please schedule a follow-up appointment in 1 month with Anwen Cannedy for blood pressure. 2)    Prescriptions: NORVASC 5 MG TABS (AMLODIPINE BESYLATE) Take 1 tablet by mouth once a day for blood pressure  #30 x 5   Entered and Authorized by:   Tereso Newcomer PA-C   Signed by:   Tereso Newcomer PA-C on 09/04/2009   Method used:   Print then Give to Patient   RxID:   [id number]   Laboratory Results   Blood Tests     HGBA1C: 6.1%   (Normal Range: Non-Diabetic - 3-6%   Control Diabetic - 6-8%) CBG Random:: 86mg /dL

## 2010-08-08 NOTE — Letter (Signed)
Summary: Lipid Letter  [facility]  [address] [place], [place] [address]   Phone: [phone]  Fax: [phone]    07/29/2010  Amber Gonzales [address] [place], [place]  [address]  Dear Amber Gonzales:  We have carefully reviewed your last lipid profile from 07/12/2010 and the results are noted below with a summary of recommendations for lipid management.    Cholesterol:       142     Goal: <200   HDL "good" Cholesterol:   47     Goal: >45   LDL "bad" Cholesterol:   74     Goal: <70   Triglycerides:       103     Goal: <150    Cholesterol is at goal.  Your kidney function was stable and rest of chemistries were fine.    TLC Diet (Therapeutic Lifestyle Change): Saturated Fats & Transfatty acids should be kept < 7% of total calories ***Reduce Saturated Fats Polyunstaurated Fat can be up to 10% of total calories Monounsaturated Fat Fat can be up to 20% of total calories Total Fat should be no greater than 25-35% of total calories Carbohydrates should be 50-60% of total calories Protein should be approximately 15% of total calories Fiber should be at least 20-30 grams a day ***Increased fiber may help lower LDL Total Cholesterol should be < 200mg /day Consider adding plant stanol/sterols to diet (example: Benacol spread) ***A higher intake of unsaturated fat may reduce Triglycerides and Increase HDL    Adjunctive Measures (may lower LIPIDS and reduce risk of Heart Attack) include: Aerobic Exercise (20-30 minutes 3-4 times a week) Limit Alcohol Consumption Weight Reduction Aspirin 75-81 mg a day by mouth (if not allergic or contraindicated) Dietary Fiber 20-30 grams a day by mouth     Current Medications: 1)    Lovastatin 40 Mg  Tabs (Lovastatin) .... Take 1 tab by mouth at bedtime 2)    Furosemide 20 Mg Tabs (Furosemide) .Marland Kitchen.. 1 tab by mouth daily in morning 3)    Atenolol 50 Mg  Tabs (Atenolol) .Marland Kitchen.. 1 tablet by mouth daily for blood  presure 4)    Adult Aspirin Low Strength 81 Mg  Tbdp (Aspirin) .... Take 1 by mouth daily 5)    Benazepril Hcl 40 Mg Tabs (Benazepril hcl) .... Take 1 tablet by mouth two times a day (increased per dr.deterding) 6)    Calcitriol 0.5 Mcg Caps (Calcitriol) .... Take 1 capsule by mouth once a day(per dr.deterding) 7)    Norvasc 5 Mg Tabs (Amlodipine besylate) .... Take 1 tablet by mouth once a day for blood pressure  If you have any questions, please call. We appreciate being able to work with you.   Sincerely,    [facility] Tereso Newcomer PA-C

## 2010-08-08 NOTE — Letter (Signed)
Summary: *HSN Results Follow up  [facility]  [address] [place], [place] [address]   Phone: [phone]  Fax: [phone]      09/06/2009   Brittany JAMAYA SLEETH [address] [place], [place]  [address]   Dear  Ms. Calia Barbary,                            ____S.Drinkard,FNP   ____D. Gore,FNP       ____B. McPherson,MD   ____V. Rankins,MD    ____E. Mulberry,MD    ____N. Daphine Deutscher, FNP  ____D. Reche Dixon, MD    ____K. Philipp Deputy, MD    __x__S. Alben Spittle, PA-C     This letter is to inform you that your recent test(s):  _______Pap Smear    ___x____Lab Test     _______X-ray    ___x____ is within acceptable limits  _______ requires a medication change  _______ requires a follow-up lab visit  _______ requires a follow-up visit with your provider   Comments:  Kidney function is stable.  Liver function is normal.       _________________________________________________________ If you have any questions, please contact our office                     Sincerely,  Tereso Newcomer PA-C [facility]

## 2010-08-08 NOTE — Assessment & Plan Note (Signed)
Summary: HTN//mm   Vital Signs:  Patient profile:   77 year old female Menstrual status:  postmenopausal Height:      62 inches Weight:      165 pounds BMI:     30.29 Temp:     98.0 degrees F oral Pulse rate:   59 / minute Pulse rhythm:   regular Resp:     18 per minute BP sitting:   118 / 70  (left arm) Cuff size:   regular  Vitals Entered By: Arm[place] Shannon (October 09, 2009 9:40 AM) CC: HTN.... pt says she has been doing good since she started amlodipine..., Hypertension Management Is Patient Diabetic? Yes Pain Assessment Patient in pain? no      CBG Result 117  Does patient need assistance? Functional Status Self care Ambulation Normal   Primary Care Provider:  Tereso Newcomer PA-C  CC:  HTN.... pt says she has been doing good since she started amlodipine... and Hypertension Management.  History of Present Illness: Here for f/u on BP. Doing well with amlodipine. No side effects.  No edema now.  Did have a little swelling at first. Has checked BP at home and readings very similar to today.  Health maint: Colo scheduled for this month. Pt. has card to set up mammo appt. All vaccines up to date. Pt deferred pap in 2009. . . she is over 40 and no longer needs (no h/o cervical cancer)  Does note hair loss.  Has noted for a year.  She keeps losing more and more.    Hypertension History:      She denies chest pain, dyspnea with exertion, and syncope.        Positive major cardiovascular risk factors include female age 41 years old or older, diabetes, hyperlipidemia, and hypertension.  Negative major cardiovascular risk factors include negative family history for ischemic heart disease and non-tobacco-user status.     Current Medications (verified): 1)  Lovastatin 40 Mg  Tabs (Lovastatin) .... Take 1 Tab By Mouth At Bedtime 2)  Triamterene-Hctz 75-50 Mg  Tabs (Triamterene-Hctz) .... 1/2 Tablet By Mouth Daily For Blood Pressure/fluid 3)  Atenolol 50 Mg  Tabs (Atenolol)  .Marland Kitchen.. 1 Tablet By Mouth Daily For Blood Presure 4)  Adult Aspirin Low Strength 81 Mg  Tbdp (Aspirin) .... Take 1 By Mouth Daily 5)  Benazepril Hcl 40 Mg Tabs (Benazepril Hcl) .... Take 1 Tablet By Mouth Two Times A Day (Increased Per Dr.deterding) 6)  Calcitriol 0.5 Mcg Caps (Calcitriol) .... Take 1 Capsule By Mouth Once A Day(Per Dr.deterding) 7)  Norvasc 5 Mg Tabs (Amlodipine Besylate) .... Take 1 Tablet By Mouth Once A Day For Blood Pressure  Allergies (verified): No Known Drug Allergies  Physical Exam  General:  alert, well-developed, and well-nourished.   Head:  normocephalic and atraumatic.   Hair loss from front to back - distribution like female pattern baldness Lungs:  normal breath sounds.   Heart:  normal rate and regular rhythm.   Abdomen:  soft.   Extremities:  no edema  Neurologic:  alert & oriented X3 and cranial nerves II-XII intact.   Psych:  normally interactive.    Diabetes Management Exam:    Foot Exam (with socks and/or shoes not present):       Sensory-Monofilament:          Left foot: normal          Right foot: normal   Impression & Recommendations:  Problem # 1:  DIABETES  MELLITUS, TYPE II (ICD-250.00) monofilament ok today Has eye doctor and gets annual exams (Dr. Pearlean Brownie  ??)  Her updated medication list for this problem includes:    Adult Aspirin Low Strength 81 Mg Tbdp (Aspirin) .Marland Kitchen... Take 1 by mouth daily    Benazepril Hcl 40 Mg Tabs (Benazepril hcl) .Marland Kitchen... Take 1 tablet by mouth two times a day (increased per dr.deterding)  Problem # 2:  HYPERTENSION (ICD-401.9) better controlled cont current mgmt  Her updated medication list for this problem includes:    Triamterene-hctz 75-50 Mg Tabs (Triamterene-hctz) .Marland Kitchen... 1/2 tablet by mouth daily for blood pressure/fluid    Atenolol 50 Mg Tabs (Atenolol) .Marland Kitchen... 1 tablet by mouth daily for blood presure    Benazepril Hcl 40 Mg Tabs (Benazepril hcl) .Marland Kitchen... Take 1 tablet by mouth two times a day (increased per  dr.deterding)    Norvasc 5 Mg Tabs (Amlodipine besylate) .Marland Kitchen... Take 1 tablet by mouth once a day for blood pressure  Problem # 3:  HAIR LOSS (ICD-704.00)  check TSH checked meds. . . .none cause alopecia  Orders: T-TSH ([id number])  Problem # 4:  PREVENTIVE HEALTH CARE (ICD-V70.0) colo this month pt to sched mammo does not need paps anymore schedule cpe in 05/2010 (d/w patient DEXA at that time)  Complete Medication List: 1)  Lovastatin 40 Mg Tabs (Lovastatin) .... Take 1 tab by mouth at bedtime 2)  Triamterene-hctz 75-50 Mg Tabs (Triamterene-hctz) .... 1/2 tablet by mouth daily for blood pressure/fluid 3)  Atenolol 50 Mg Tabs (Atenolol) .Marland Kitchen.. 1 tablet by mouth daily for blood presure 4)  Adult Aspirin Low Strength 81 Mg Tbdp (Aspirin) .... Take 1 by mouth daily 5)  Benazepril Hcl 40 Mg Tabs (Benazepril hcl) .... Take 1 tablet by mouth two times a day (increased per dr.deterding) 6)  Calcitriol 0.5 Mcg Caps (Calcitriol) .... Take 1 capsule by mouth once a day(per dr.deterding) 7)  Norvasc 5 Mg Tabs (Amlodipine besylate) .... Take 1 tablet by mouth once a day for blood pressure  Hypertension Assessment/Plan:      The patient's hypertensive risk group is category C: Target organ damage and/or diabetes.  Her calculated 10 year risk of coronary heart disease is 11 %.  Today's blood pressure is 118/70.  Her blood pressure goal is < 125/75.  Patient Instructions: 1)  Please schedule a follow-up appointment in 3 months with Mkenzie Dotts for diabetes and blood pressure. 2)  Schedule CPE with Lorin Picket in November 2011. 3)     Last LDL:                                                 82 (06/06/2009 9:40:00 PM)        Diabetic Foot Exam Last Podiatry Exam Date: 10/09/2009  Foot Inspection Is there a history of a foot ulcer?              No Is there a foot ulcer now?              No Can the patient see the bottom of their feet?          Yes Are the shoes appropriate in style and fit?           Yes Is there swelling or an abnormal foot shape?          No Are the  toenails long?                No Are the toenails thick?                No Are the toenails ingrown?              No Is there heavy callous build-up?              No Is there a claw toe deformity?                          No Is there elevated skin temperature?            No Is there limited ankle dorsiflexion?            No Is there foot or ankle muscle weakness?            No Do you have pain in calf while walking?           No      Diabetic Foot Care Education :Patient educated on appropriate care of diabetic feet.     10-g (5.07) Semmes-Weinstein Monofilament Test Performed by: Armenia Shannon          Right Foot          Left Foot Visual Inspection               Test Control      normal         normal Site 1         normal         normal Site 2         normal         normal Site 3         normal         normal Site 4         normal         normal Site 5         normal         normal Site 6         normal         normal Site 7         normal         normal Site 8         normal         normal Site 9         abnormal         abnormal Site 10         normal         normal  Impression      normal         normal

## 2010-08-30 ENCOUNTER — Encounter (INDEPENDENT_AMBULATORY_CARE_PROVIDER_SITE_OTHER): Payer: Self-pay | Admitting: Internal Medicine

## 2010-09-06 ENCOUNTER — Emergency Department (HOSPITAL_COMMUNITY): Payer: Medicare Other

## 2010-09-06 ENCOUNTER — Emergency Department (HOSPITAL_COMMUNITY)
Admission: EM | Admit: 2010-09-06 | Discharge: 2010-09-06 | Disposition: A | Discharge status: Home / Self Care | Payer: Medicare Other | Attending: Emergency Medicine | Admitting: Emergency Medicine

## 2010-09-06 DIAGNOSIS — S8990XA Unspecified injury of unspecified lower leg, initial encounter: Secondary | ICD-10-CM | POA: Insufficient documentation

## 2010-09-06 DIAGNOSIS — M7989 Other specified soft tissue disorders: Secondary | ICD-10-CM | POA: Insufficient documentation

## 2010-09-06 DIAGNOSIS — W2203XA Walked into furniture, initial encounter: Secondary | ICD-10-CM | POA: Insufficient documentation

## 2010-09-06 DIAGNOSIS — S99929A Unspecified injury of unspecified foot, initial encounter: Secondary | ICD-10-CM | POA: Insufficient documentation

## 2010-09-06 DIAGNOSIS — I10 Essential (primary) hypertension: Secondary | ICD-10-CM | POA: Insufficient documentation

## 2010-09-06 DIAGNOSIS — Y929 Unspecified place or not applicable: Secondary | ICD-10-CM | POA: Insufficient documentation

## 2010-09-06 DIAGNOSIS — Z79899 Other long term (current) drug therapy: Secondary | ICD-10-CM | POA: Insufficient documentation

## 2010-09-06 DIAGNOSIS — E785 Hyperlipidemia, unspecified: Secondary | ICD-10-CM | POA: Insufficient documentation

## 2010-09-06 DIAGNOSIS — M79609 Pain in unspecified limb: Secondary | ICD-10-CM | POA: Insufficient documentation

## 2010-09-06 DIAGNOSIS — E119 Type 2 diabetes mellitus without complications: Secondary | ICD-10-CM | POA: Insufficient documentation

## 2010-09-12 NOTE — Letter (Signed)
Summary: [facility]   Imported By: Arta Bruce 09/05/2010 12:28:04  _____________________________________________________________________  External Attachment:    Type:   Image     Comment:   External Document

## 2010-09-24 ENCOUNTER — Encounter: Payer: Self-pay | Admitting: Internal Medicine

## 2010-09-24 ENCOUNTER — Encounter (INDEPENDENT_AMBULATORY_CARE_PROVIDER_SITE_OTHER): Payer: Self-pay | Admitting: Internal Medicine

## 2010-09-24 LAB — CONVERTED CEMR LAB: Blood Glucose, Fingerstick: 109

## 2010-10-03 NOTE — Assessment & Plan Note (Signed)
Summary: 3 month f/u on BP and muscle cramping   Vital Signs:  Patient profile:   77 year old female Menstrual status:  postmenopausal Weight:      162.44 pounds Temp:     98.2 degrees F oral Pulse rate:   54 / minute Pulse rhythm:   regular Resp:     16 per minute BP sitting:   146 / 64  (left arm) Cuff size:   regular  Vitals Entered By: Hale Drone CMA (September 24, 2010 10:41 AM) CC: 3 month f/u on BP and muscle cramping. Muscle cramps are much better.... F/u from [facility] hosp for injury to toe on right foot... Was given Antibiotics.  Is Patient Diabetic? Yes Pain Assessment Patient in pain? no      CBG Result 109 CBG Device ID [id number]  Does patient need assistance? Functional Status Self care Ambulation Normal   Primary Care Provider:  Tereso Newcomer PA-C  CC:  3 month f/u on BP and muscle cramping. Muscle cramps are much better.... F/u from [facility] hosp for injury to toe on right foot... Was given Antibiotics. .  History of Present Illness: 1.  Stubbed right second toe at begining of March.  Was seen in ED.  Xrays negative for fracture.  Pain better, but still discolored.  Was treated with Cephalexin for possible infection of the toe.  2.  DM:  A1C is 6.3% today.  Still has not made appt. with eye physician for yearly check.  3.  Hypertension:  Did not bring in her cuff to check it against ours.  Reportedly, has been told by Dr. Darrick Penna that he would like to see her bp lower as well.  She states her bp ranges in 120-130s range over 70s at home.  4.  Muscle Cramps:  much better with change of Dyazide to Furosemide.    Current Medications (verified): 1)  Lovastatin 40 Mg  Tabs (Lovastatin) .... Take 1 Tab By Mouth At Bedtime 2)  Furosemide 20 Mg Tabs (Furosemide) .Marland Kitchen.. 1 Tab By Mouth Daily in Morning 3)  Atenolol 50 Mg  Tabs (Atenolol) .Marland Kitchen.. 1 Tablet By Mouth Daily For Blood Presure 4)  Adult Aspirin Low Strength 81 Mg  Tbdp (Aspirin) .... Take 1 By Mouth Daily 5)  Benazepril  Hcl 40 Mg Tabs (Benazepril Hcl) .... Take 1 Tablet By Mouth Two Times A Day (Increased Per Dr.deterding) 6)  Calcitriol 0.5 Mcg Caps (Calcitriol) .... Take 1 Capsule By Mouth Once A Day(Per Dr.deterding) 7)  Norvasc 5 Mg Tabs (Amlodipine Besylate) .... Take 1 Tablet By Mouth Once A Day For Blood Pressure  Allergies (verified): No Known Drug Allergies  Physical Exam  General:  NAD Lungs:  Normal respiratory effort, chest expands symmetrically. Lungs are clear to auscultation, no crackles or wheezes. Heart:  Normal rate and regular rhythm. S1 and S2 normal without gallop, murmur, click, rub or other extra sounds.  Radial pulses normal and equal Extremities:  Mild swelling of right ankle and foot.  Right second toe with scabbing over dorsum, but no fluctuance.  Mild hyperpigmentation below scab.  NT   Impression & Recommendations:  Problem # 1:  LEG CRAMPS (ICD-729.82) Resolved  Problem # 2:  DIABETES MELLITUS, TYPE II (ICD-250.00) Controlled Her updated medication list for this problem includes:    Adult Aspirin Low Strength 81 Mg Tbdp (Aspirin) .Marland Kitchen... Take 1 by mouth daily    Benazepril Hcl 40 Mg Tabs (Benazepril hcl) .Marland Kitchen... Take 1 tablet by mouth two  times a day (increased per dr.deterding)  Orders: Capillary Blood Glucose/CBG (82948) Hgb A1C (82956OZ)  Problem # 3:  HYPERTENSION (ICD-401.9) Increase Amlodipine to 10 mg daily. Limited by pulse to increase beta blockade Her updated medication list for this problem includes:    Furosemide 20 Mg Tabs (Furosemide) .Marland Kitchen... 1 tab by mouth daily in morning    Atenolol 50 Mg Tabs (Atenolol) .Marland Kitchen... 1 tablet by mouth daily for blood presure    Benazepril Hcl 40 Mg Tabs (Benazepril hcl) .Marland Kitchen... Take 1 tablet by mouth two times a day (increased per dr.deterding)    Amlodipine Besylate 10 Mg Tabs (Amlodipine besylate) .Marland Kitchen... 1 tab by mouth daily for blood pressure  Problem # 4:  CELLULITIS OF RIGHT TOE (ICD-681.9) Resolved  Complete Medication  List: 1)  Lovastatin 40 Mg Tabs (Lovastatin) .... Take 1 tab by mouth at bedtime 2)  Furosemide 20 Mg Tabs (Furosemide) .Marland Kitchen.. 1 tab by mouth daily in morning 3)  Atenolol 50 Mg Tabs (Atenolol) .Marland Kitchen.. 1 tablet by mouth daily for blood presure 4)  Adult Aspirin Low Strength 81 Mg Tbdp (Aspirin) .... Take 1 by mouth daily 5)  Benazepril Hcl 40 Mg Tabs (Benazepril hcl) .... Take 1 tablet by mouth two times a day (increased per dr.deterding) 6)  Calcitriol 0.5 Mcg Caps (Calcitriol) .... Take 1 capsule by mouth once a day(per dr.deterding) 7)  Amlodipine Besylate 10 Mg Tabs (Amlodipine besylate) .Marland Kitchen.. 1 tab by mouth daily for blood pressure  Patient Instructions: 1)  Nurse visit for bp check in 1 month--Amlodipine increased to 10 mg daily 2)  Follow up with Dr. Delrae Alfred in 4 months --hypertension, DM 3)  Get your eyes checked with your eye doctor, PLEASE!!!!!!!!!!!!!!!! Prescriptions: AMLODIPINE BESYLATE 10 MG TABS (AMLODIPINE BESYLATE) 1 tab by mouth daily for blood pressure  #30 x 11   Entered and Authorized by:   Julieanne Manson MD   Signed by:   Julieanne Manson MD on 09/24/2010   Method used:   Electronically to        [facility]  [address] [phone]* (retail)       [address]       [place], [place]  [id number]       Ph: [phone] or [phone]       Fax: [phone]   RxID:   [id number]    Orders Added: 1)  Capillary Blood Glucose/CBG [82948] 2)  Hgb A1C [83036QW] 3)  Est. Patient Level IV [16606]  Appended Document: A1c result     Allergies: No Known Drug Allergies   Complete Medication List: 1)  Lovastatin 40 Mg Tabs (Lovastatin) .... Take 1 tab by mouth at bedtime 2)  Furosemide 20 Mg Tabs (Furosemide) .Marland Kitchen.. 1 tab by mouth daily in morning 3)  Atenolol 50 Mg Tabs (Atenolol) .Marland Kitchen.. 1 tablet by mouth daily for blood presure 4)  Adult Aspirin Low Strength 81 Mg Tbdp (Aspirin) .... Take 1 by mouth daily 5)  Benazepril Hcl 40 Mg Tabs  (Benazepril hcl) .... Take 1 tablet by mouth two times a day (increased per dr.deterding) 6)  Calcitriol 0.5 Mcg Caps (Calcitriol) .... Take 1 capsule by mouth once a day(per dr.deterding) 7)  Amlodipine Besylate 10 Mg Tabs (Amlodipine besylate) .Marland Kitchen.. 1 tab by mouth daily for blood pressure      Laboratory Results   Blood Tests   Date/Time Received: September 24, 2010 11:42 AM   HGBA1C: 6.3%   (Normal Range:  Non-Diabetic - 3-6%   Control Diabetic - 6-8%)

## 2010-11-08 ENCOUNTER — Inpatient Hospital Stay (INDEPENDENT_AMBULATORY_CARE_PROVIDER_SITE_OTHER)
Admission: RE | Admit: 2010-11-08 | Discharge: 2010-11-08 | Disposition: A | Discharge status: Home / Self Care | Payer: Medicare Other | Source: Ambulatory Visit | Attending: Emergency Medicine | Admitting: Emergency Medicine

## 2010-11-08 ENCOUNTER — Ambulatory Visit (INDEPENDENT_AMBULATORY_CARE_PROVIDER_SITE_OTHER): Payer: Medicare Other

## 2010-11-08 DIAGNOSIS — S8000XA Contusion of unspecified knee, initial encounter: Secondary | ICD-10-CM

## 2014-03-21 ENCOUNTER — Other Ambulatory Visit (HOSPITAL_COMMUNITY): Payer: Self-pay | Admitting: Family Medicine

## 2014-03-21 DIAGNOSIS — M810 Age-related osteoporosis without current pathological fracture: Secondary | ICD-10-CM

## 2014-03-21 DIAGNOSIS — Z1231 Encounter for screening mammogram for malignant neoplasm of breast: Secondary | ICD-10-CM

## 2014-04-19 ENCOUNTER — Ambulatory Visit (HOSPITAL_COMMUNITY)
Admission: RE | Admit: 2014-04-19 | Discharge: 2014-04-19 | Disposition: A | Discharge status: Home / Self Care | Payer: Medicare Other | Source: Ambulatory Visit | Attending: Family Medicine | Admitting: Family Medicine

## 2014-04-19 DIAGNOSIS — Z780 Asymptomatic menopausal state: Secondary | ICD-10-CM | POA: Diagnosis not present

## 2014-04-19 DIAGNOSIS — Z13820 Encounter for screening for osteoporosis: Secondary | ICD-10-CM | POA: Insufficient documentation

## 2014-04-19 DIAGNOSIS — Z1231 Encounter for screening mammogram for malignant neoplasm of breast: Secondary | ICD-10-CM | POA: Diagnosis present

## 2014-04-19 DIAGNOSIS — M810 Age-related osteoporosis without current pathological fracture: Secondary | ICD-10-CM

## 2017-04-20 ENCOUNTER — Other Ambulatory Visit: Payer: Self-pay | Admitting: Family Medicine

## 2017-04-20 DIAGNOSIS — R5381 Other malaise: Secondary | ICD-10-CM

## 2017-05-13 ENCOUNTER — Other Ambulatory Visit: Payer: Self-pay | Admitting: Family Medicine

## 2017-05-13 DIAGNOSIS — E2839 Other primary ovarian failure: Secondary | ICD-10-CM

## 2017-05-13 DIAGNOSIS — Z1231 Encounter for screening mammogram for malignant neoplasm of breast: Secondary | ICD-10-CM

## 2017-06-12 ENCOUNTER — Ambulatory Visit
Admission: RE | Admit: 2017-06-12 | Discharge: 2017-06-12 | Disposition: A | Discharge status: Home / Self Care | Payer: Medicare Other | Source: Ambulatory Visit | Attending: Family Medicine | Admitting: Family Medicine

## 2017-06-12 DIAGNOSIS — Z1231 Encounter for screening mammogram for malignant neoplasm of breast: Secondary | ICD-10-CM

## 2017-06-16 ENCOUNTER — Other Ambulatory Visit: Payer: Self-pay | Admitting: Family Medicine

## 2017-06-16 DIAGNOSIS — R928 Other abnormal and inconclusive findings on diagnostic imaging of breast: Secondary | ICD-10-CM

## 2017-06-22 ENCOUNTER — Ambulatory Visit
Admission: RE | Admit: 2017-06-22 | Discharge: 2017-06-22 | Disposition: A | Discharge status: Home / Self Care | Payer: Medicare Other | Source: Ambulatory Visit | Attending: Family Medicine | Admitting: Family Medicine

## 2017-06-22 ENCOUNTER — Other Ambulatory Visit: Payer: Self-pay | Admitting: Family Medicine

## 2017-06-22 DIAGNOSIS — R928 Other abnormal and inconclusive findings on diagnostic imaging of breast: Secondary | ICD-10-CM

## 2017-06-22 DIAGNOSIS — N6320 Unspecified lump in the left breast, unspecified quadrant: Secondary | ICD-10-CM

## 2017-06-25 ENCOUNTER — Other Ambulatory Visit: Payer: Self-pay | Admitting: Family Medicine

## 2017-06-25 ENCOUNTER — Ambulatory Visit
Admission: RE | Admit: 2017-06-25 | Discharge: 2017-06-25 | Disposition: A | Discharge status: Home / Self Care | Payer: Medicare Other | Source: Ambulatory Visit | Attending: Family Medicine | Admitting: Family Medicine

## 2017-06-25 DIAGNOSIS — N6320 Unspecified lump in the left breast, unspecified quadrant: Secondary | ICD-10-CM

## 2019-05-24 ENCOUNTER — Other Ambulatory Visit: Payer: Self-pay

## 2019-05-24 DIAGNOSIS — Z20822 Contact with and (suspected) exposure to covid-19: Secondary | ICD-10-CM

## 2019-05-25 LAB — NOVEL CORONAVIRUS, NAA: SARS-CoV-2, NAA: NOT DETECTED

## 2019-12-22 ENCOUNTER — Ambulatory Visit: Payer: Medicare Other | Attending: Internal Medicine

## 2019-12-22 DIAGNOSIS — Z20822 Contact with and (suspected) exposure to covid-19: Secondary | ICD-10-CM

## 2019-12-23 LAB — NOVEL CORONAVIRUS, NAA: SARS-CoV-2, NAA: NOT DETECTED

## 2019-12-23 LAB — SARS-COV-2, NAA 2 DAY TAT

## 2020-07-18 DIAGNOSIS — E1122 Type 2 diabetes mellitus with diabetic chronic kidney disease: Secondary | ICD-10-CM | POA: Diagnosis not present

## 2020-07-18 DIAGNOSIS — E1169 Type 2 diabetes mellitus with other specified complication: Secondary | ICD-10-CM | POA: Diagnosis not present

## 2020-07-18 DIAGNOSIS — E7800 Pure hypercholesterolemia, unspecified: Secondary | ICD-10-CM | POA: Diagnosis not present

## 2020-07-18 DIAGNOSIS — M810 Age-related osteoporosis without current pathological fracture: Secondary | ICD-10-CM | POA: Diagnosis not present

## 2020-07-18 DIAGNOSIS — I10 Essential (primary) hypertension: Secondary | ICD-10-CM | POA: Diagnosis not present

## 2020-07-18 DIAGNOSIS — N183 Chronic kidney disease, stage 3 unspecified: Secondary | ICD-10-CM | POA: Diagnosis not present

## 2020-09-26 DIAGNOSIS — E1122 Type 2 diabetes mellitus with diabetic chronic kidney disease: Secondary | ICD-10-CM | POA: Diagnosis not present

## 2020-09-26 DIAGNOSIS — N183 Chronic kidney disease, stage 3 unspecified: Secondary | ICD-10-CM | POA: Diagnosis not present

## 2020-09-26 DIAGNOSIS — M810 Age-related osteoporosis without current pathological fracture: Secondary | ICD-10-CM | POA: Diagnosis not present

## 2020-09-26 DIAGNOSIS — I10 Essential (primary) hypertension: Secondary | ICD-10-CM | POA: Diagnosis not present

## 2020-09-26 DIAGNOSIS — E7800 Pure hypercholesterolemia, unspecified: Secondary | ICD-10-CM | POA: Diagnosis not present

## 2020-09-26 DIAGNOSIS — E1169 Type 2 diabetes mellitus with other specified complication: Secondary | ICD-10-CM | POA: Diagnosis not present

## 2020-10-12 DIAGNOSIS — N183 Chronic kidney disease, stage 3 unspecified: Secondary | ICD-10-CM | POA: Diagnosis not present

## 2020-10-12 DIAGNOSIS — I10 Essential (primary) hypertension: Secondary | ICD-10-CM | POA: Diagnosis not present

## 2020-10-12 DIAGNOSIS — E1122 Type 2 diabetes mellitus with diabetic chronic kidney disease: Secondary | ICD-10-CM | POA: Diagnosis not present

## 2020-10-12 DIAGNOSIS — E7800 Pure hypercholesterolemia, unspecified: Secondary | ICD-10-CM | POA: Diagnosis not present

## 2020-10-29 ENCOUNTER — Emergency Department (HOSPITAL_BASED_OUTPATIENT_CLINIC_OR_DEPARTMENT_OTHER): Payer: Medicare Other | Admitting: Radiology

## 2020-10-29 ENCOUNTER — Emergency Department (HOSPITAL_BASED_OUTPATIENT_CLINIC_OR_DEPARTMENT_OTHER)
Admission: EM | Admit: 2020-10-29 | Discharge: 2020-10-29 | Disposition: A | Discharge status: Home / Self Care | Payer: Medicare Other | Attending: Emergency Medicine | Admitting: Emergency Medicine

## 2020-10-29 ENCOUNTER — Encounter (HOSPITAL_BASED_OUTPATIENT_CLINIC_OR_DEPARTMENT_OTHER): Payer: Self-pay | Admitting: Emergency Medicine

## 2020-10-29 ENCOUNTER — Other Ambulatory Visit: Payer: Self-pay

## 2020-10-29 DIAGNOSIS — E119 Type 2 diabetes mellitus without complications: Secondary | ICD-10-CM | POA: Insufficient documentation

## 2020-10-29 DIAGNOSIS — W010XXA Fall on same level from slipping, tripping and stumbling without subsequent striking against object, initial encounter: Secondary | ICD-10-CM | POA: Diagnosis not present

## 2020-10-29 DIAGNOSIS — R030 Elevated blood-pressure reading, without diagnosis of hypertension: Secondary | ICD-10-CM

## 2020-10-29 DIAGNOSIS — I10 Essential (primary) hypertension: Secondary | ICD-10-CM | POA: Diagnosis not present

## 2020-10-29 DIAGNOSIS — M545 Low back pain, unspecified: Secondary | ICD-10-CM | POA: Diagnosis not present

## 2020-10-29 DIAGNOSIS — S22000A Wedge compression fracture of unspecified thoracic vertebra, initial encounter for closed fracture: Secondary | ICD-10-CM

## 2020-10-29 DIAGNOSIS — S22080A Wedge compression fracture of T11-T12 vertebra, initial encounter for closed fracture: Secondary | ICD-10-CM | POA: Diagnosis not present

## 2020-10-29 DIAGNOSIS — S24109A Unspecified injury at unspecified level of thoracic spinal cord, initial encounter: Secondary | ICD-10-CM | POA: Diagnosis present

## 2020-10-29 HISTORY — DX: Type 2 diabetes mellitus without complications: E11.9

## 2020-10-29 HISTORY — DX: Disorder of kidney and ureter, unspecified: N28.9

## 2020-10-29 HISTORY — DX: Essential (primary) hypertension: I10

## 2020-10-29 MED ORDER — ACETAMINOPHEN 325 MG PO TABS
650.0000 mg | ORAL_TABLET | Freq: Once | ORAL | Status: AC
  Administered 2020-10-29: 650 mg via ORAL
  Filled 2020-10-29: qty 2

## 2020-10-29 MED ORDER — METHOCARBAMOL 500 MG PO TABS: 500.0000 mg | ORAL_TABLET | Freq: Three times a day (TID) | ORAL | 0 refills | Status: DC | PRN

## 2020-10-29 NOTE — ED Provider Notes (Signed)
[facility] EMERGENCY DEPT Provider Note   CSN: [id number] Arrival date & time: 10/29/20  1214     History Chief Complaint  Patient presents with  . Back Pain    Amber Gonzales is a 87 y.o. female.  Pt c/o slip and fall two days ago. Fell onto bottom/back side. C/o low back pain since. Symptoms acute onset, moderate, constant, persistent, dull, non radiating. No faintness or dizziness prior to fall. No anticoag use. No head trauma or loc. No headache. No neck or upper back pain. No radicular pain or numbness/weakness. No saddle area numbness. No problems w normal bowel or bladder function. No problems w gait. Denies hx osteoporosis or compression fracture. No other pain or injury. Skin intact.   The history is provided by the patient.  Back Pain Associated symptoms: no abdominal pain, no chest pain, no fever, no headaches, no numbness and no weakness        Past Medical History:  Diagnosis Date  . Diabetes mellitus without complication (HCC)   . Hypertension   . Renal disorder     Patient Active Problem List   Diagnosis Date Noted  . LEG CRAMPS 06/25/2010  . MAMMOGRAM, ABNORMAL, LEFT 03/07/2010  . HAIR LOSS 10/09/2009  . DIABETES MELLITUS, TYPE II 06/06/2009  . NEPHROSCLEROSIS 01/31/2009  . SECONDARY HYPERPARATHYROIDISM 11/01/2008  . GERD 05/20/2007  . OSTEOPOROSIS 05/20/2007  . HYPERLIPIDEMIA 03/31/2007  . HYPERTENSION 03/31/2007  . RENAL DISEASE, CHRONIC, MILD 03/31/2007  . SHOULDER PAIN, RIGHT, HX OF 03/31/2007    No past surgical history on file.   OB History   No obstetric history on file.     No family history on file.  Social History   Tobacco Use  . Smoking status: Never Smoker  . Smokeless tobacco: Never Used  Substance Use Topics  . Alcohol use: Never    Home Medications Prior to Admission medications   Not on File    Allergies    Patient has no known allergies.  Review of Systems   Review of Systems  Constitutional:  Negative for fever.  HENT: Negative for nosebleeds.   Eyes: Negative for visual disturbance.  Respiratory: Negative for shortness of breath.   Cardiovascular: Negative for chest pain.  Gastrointestinal: Negative for abdominal pain and vomiting.  Genitourinary: Negative for flank pain.  Musculoskeletal: Positive for back pain. Negative for neck pain.  Skin: Negative for rash.  Neurological: Negative for weakness, numbness and headaches.  Hematological: Does not bruise/bleed easily.  Psychiatric/Behavioral: Negative for confusion.    Physical Exam Updated Vital Signs BP (!) 199/76 (BP Location: Left Arm)   Pulse 70   Temp 98.6 F (37 C) (Oral)   Resp 16   Ht 1.6 m (5\' 3" )   Wt 60.3 kg   SpO2 98%   BMI 23.56 kg/m   Physical Exam Vitals and nursing note reviewed.  Constitutional:      Appearance: Normal appearance. She is well-developed.  HENT:     Head: Atraumatic.     Nose: Nose normal.     Mouth/Throat:     Mouth: Mucous membranes are moist.  Eyes:     General: No scleral icterus.    Conjunctiva/sclera: Conjunctivae normal.     Pupils: Pupils are equal, round, and reactive to light.  Neck:     Trachea: No tracheal deviation.  Cardiovascular:     Rate and Rhythm: Normal rate.     Pulses: Normal pulses.     Heart sounds: Normal  heart sounds. No murmur heard. No friction rub. No gallop.   Pulmonary:     Effort: Pulmonary effort is normal. No respiratory distress.     Breath sounds: Normal breath sounds.  Chest:     Chest wall: No tenderness.  Abdominal:     General: There is no distension.     Palpations: Abdomen is soft.     Tenderness: There is no abdominal tenderness.  Genitourinary:    Comments: No cva tenderness.  Musculoskeletal:        General: No swelling.     Cervical back: Normal range of motion and neck supple. No rigidity or tenderness. No muscular tenderness.     Comments: Upper lumbar tenderness, otherwise CTLS spine, non tender, aligned, no step  off. Good rom bil extremities without pain or focal bony tenderness.   Skin:    General: Skin is warm and dry.     Findings: No rash.  Neurological:     Mental Status: She is alert.     Comments: Alert, speech normal. Motor/sens grossly intact bil. stre 5/5 in bil legs. Steady gait.   Psychiatric:        Mood and Affect: Mood normal.     ED Results / Procedures / Treatments   Labs (all labs ordered are listed, but only abnormal results are displayed) Labs Reviewed - No data to display  EKG None  Radiology DG Lumbar Spine Complete  Result Date: 10/29/2020 CLINICAL DATA:  Fall.  Pain EXAM: LUMBAR SPINE - COMPLETE 4+ VIEW COMPARISON:  None. FINDINGS: Mild compression fracture of T11 of indeterminate age. No lumbar fracture or mass Mild anterolisthesis L3-4. Disc and facet degeneration lower lumbar spine. Advanced facet degeneration on the right L4-5 and L5-S1. Atherosclerotic aorta. IMPRESSION: Mild compression fracture T11 of indeterminate age. Electronically Signed   By: Marlan Palau M.D.   On: 10/29/2020 14:14    Procedures Procedures   Medications Ordered in ED Medications - No data to display  ED Course  I have reviewed the triage vital signs and the nursing notes.  Pertinent labs & imaging results that were available during my care of the patient were reviewed by me and considered in my medical decision making (see chart for details).    MDM Rules/Calculators/A&P                          Xrays.  Reviewed nursing notes and prior charts for additional history.   Xrays reviewed/interpreted by me -  t11 compression fx.   No red flags, no numbness/weakness, no radicular pain, no problems w bowel or bladder fxn.   Acetaminophen po. rx robaxin.   Pt appears stable for d/c.   Rec pcp f/u. Fall precautions.    Final Clinical Impression(s) / ED Diagnoses Final diagnoses:  None    Rx / DC Orders ED Discharge Orders    None       Cathren Laine,  MD 10/29/20 1423

## 2020-10-29 NOTE — Discharge Instructions (Addendum)
It was our pleasure to provide your ER care today - we hope that you feel better.  Your xrays show a mild T11 compression fracture.   Rest. Avoid heavy lifting > 10 lbs or bending at waist. Try heat therapy/heating pad to sore area. Take acetaminophen as need for pain. You may try robaxin as need for muscle pain/spasm.  Fall precautions.   Follow up with primary care doctor in the next 1-2 weeks - also have your blood pressure rechecked then as it is high today.   Return to ER if worse, new symptoms, severe or intractable pain, numbness/weakness, or other concern.

## 2020-10-29 NOTE — ED Triage Notes (Signed)
L side low back pain since Saturday after slipping.

## 2020-11-05 DIAGNOSIS — Z0000 Encounter for general adult medical examination without abnormal findings: Secondary | ICD-10-CM | POA: Diagnosis not present

## 2020-11-22 DIAGNOSIS — E1122 Type 2 diabetes mellitus with diabetic chronic kidney disease: Secondary | ICD-10-CM | POA: Diagnosis not present

## 2020-11-22 DIAGNOSIS — I10 Essential (primary) hypertension: Secondary | ICD-10-CM | POA: Diagnosis not present

## 2020-11-22 DIAGNOSIS — M810 Age-related osteoporosis without current pathological fracture: Secondary | ICD-10-CM | POA: Diagnosis not present

## 2020-11-22 DIAGNOSIS — E7800 Pure hypercholesterolemia, unspecified: Secondary | ICD-10-CM | POA: Diagnosis not present

## 2020-11-22 DIAGNOSIS — E1169 Type 2 diabetes mellitus with other specified complication: Secondary | ICD-10-CM | POA: Diagnosis not present

## 2020-11-22 DIAGNOSIS — N183 Chronic kidney disease, stage 3 unspecified: Secondary | ICD-10-CM | POA: Diagnosis not present

## 2020-11-26 ENCOUNTER — Other Ambulatory Visit (HOSPITAL_COMMUNITY): Payer: Self-pay

## 2020-11-26 DIAGNOSIS — U071 COVID-19: Secondary | ICD-10-CM | POA: Diagnosis not present

## 2020-11-26 DIAGNOSIS — R059 Cough, unspecified: Secondary | ICD-10-CM | POA: Diagnosis not present

## 2020-11-26 DIAGNOSIS — Z6824 Body mass index (BMI) 24.0-24.9, adult: Secondary | ICD-10-CM | POA: Diagnosis not present

## 2020-11-26 MED ORDER — MOLNUPIRAVIR 200 MG PO CAPS
800.0000 mg | ORAL_CAPSULE | Freq: Two times a day (BID) | ORAL | 0 refills | Status: DC
  Filled 2020-11-26: qty 40, 5d supply, fill #0

## 2020-12-10 ENCOUNTER — Emergency Department (HOSPITAL_COMMUNITY): Payer: Medicare Other

## 2020-12-10 ENCOUNTER — Encounter (HOSPITAL_COMMUNITY): Payer: Self-pay | Admitting: *Deleted

## 2020-12-10 ENCOUNTER — Other Ambulatory Visit: Payer: Self-pay

## 2020-12-10 ENCOUNTER — Emergency Department (HOSPITAL_COMMUNITY)
Admission: EM | Admit: 2020-12-10 | Discharge: 2020-12-11 | Disposition: A | Discharge status: Home / Self Care | Payer: Medicare Other | Attending: Emergency Medicine | Admitting: Emergency Medicine

## 2020-12-10 DIAGNOSIS — R634 Abnormal weight loss: Secondary | ICD-10-CM | POA: Diagnosis not present

## 2020-12-10 DIAGNOSIS — N289 Disorder of kidney and ureter, unspecified: Secondary | ICD-10-CM | POA: Insufficient documentation

## 2020-12-10 DIAGNOSIS — R9082 White matter disease, unspecified: Secondary | ICD-10-CM | POA: Diagnosis not present

## 2020-12-10 DIAGNOSIS — R2689 Other abnormalities of gait and mobility: Secondary | ICD-10-CM | POA: Diagnosis not present

## 2020-12-10 DIAGNOSIS — G319 Degenerative disease of nervous system, unspecified: Secondary | ICD-10-CM | POA: Diagnosis not present

## 2020-12-10 DIAGNOSIS — R269 Unspecified abnormalities of gait and mobility: Secondary | ICD-10-CM | POA: Diagnosis not present

## 2020-12-10 DIAGNOSIS — E119 Type 2 diabetes mellitus without complications: Secondary | ICD-10-CM | POA: Diagnosis not present

## 2020-12-10 DIAGNOSIS — R531 Weakness: Secondary | ICD-10-CM | POA: Diagnosis not present

## 2020-12-10 DIAGNOSIS — I10 Essential (primary) hypertension: Secondary | ICD-10-CM | POA: Insufficient documentation

## 2020-12-10 DIAGNOSIS — R200 Anesthesia of skin: Secondary | ICD-10-CM | POA: Diagnosis not present

## 2020-12-10 DIAGNOSIS — I6782 Cerebral ischemia: Secondary | ICD-10-CM | POA: Diagnosis not present

## 2020-12-10 DIAGNOSIS — R2681 Unsteadiness on feet: Secondary | ICD-10-CM | POA: Diagnosis not present

## 2020-12-10 DIAGNOSIS — U071 COVID-19: Secondary | ICD-10-CM | POA: Diagnosis not present

## 2020-12-10 DIAGNOSIS — E7800 Pure hypercholesterolemia, unspecified: Secondary | ICD-10-CM | POA: Diagnosis not present

## 2020-12-10 DIAGNOSIS — R296 Repeated falls: Secondary | ICD-10-CM | POA: Diagnosis not present

## 2020-12-10 LAB — CBC
HCT: 35.9 % — ABNORMAL LOW (ref 36.0–46.0)
Hemoglobin: 11.7 g/dL — ABNORMAL LOW (ref 12.0–15.0)
MCH: 33.1 pg (ref 26.0–34.0)
MCHC: 32.6 g/dL (ref 30.0–36.0)
MCV: 101.7 fL — ABNORMAL HIGH (ref 80.0–100.0)
Platelets: 262 10*3/uL (ref 150–400)
RBC: 3.53 MIL/uL — ABNORMAL LOW (ref 3.87–5.11)
RDW: 11.9 % (ref 11.5–15.5)
WBC: 6 10*3/uL (ref 4.0–10.5)
nRBC: 0 % (ref 0.0–0.2)

## 2020-12-10 LAB — DIFFERENTIAL
Abs Immature Granulocytes: 0.02 10*3/uL (ref 0.00–0.07)
Basophils Absolute: 0.1 10*3/uL (ref 0.0–0.1)
Basophils Relative: 1 %
Eosinophils Absolute: 0 10*3/uL (ref 0.0–0.5)
Eosinophils Relative: 1 %
Immature Granulocytes: 0 %
Lymphocytes Relative: 27 %
Lymphs Abs: 1.6 10*3/uL (ref 0.7–4.0)
Monocytes Absolute: 0.6 10*3/uL (ref 0.1–1.0)
Monocytes Relative: 9 %
Neutro Abs: 3.7 10*3/uL (ref 1.7–7.7)
Neutrophils Relative %: 62 %

## 2020-12-10 LAB — COMPREHENSIVE METABOLIC PANEL
ALT: 15 U/L (ref 0–44)
AST: 23 U/L (ref 15–41)
Albumin: 3.7 g/dL (ref 3.5–5.0)
Alkaline Phosphatase: 123 U/L (ref 38–126)
Anion gap: 9 (ref 5–15)
BUN: 30 mg/dL — ABNORMAL HIGH (ref 8–23)
CO2: 27 mmol/L (ref 22–32)
Calcium: 9.1 mg/dL (ref 8.9–10.3)
Chloride: 105 mmol/L (ref 98–111)
Creatinine, Ser: 2.53 mg/dL — ABNORMAL HIGH (ref 0.44–1.00)
GFR, Estimated: 18 mL/min — ABNORMAL LOW (ref >60)
Glucose, Bld: 152 mg/dL — ABNORMAL HIGH (ref 70–99)
Potassium: 3.3 mmol/L — ABNORMAL LOW (ref 3.5–5.1)
Sodium: 141 mmol/L (ref 135–145)
Total Bilirubin: 0.9 mg/dL (ref 0.3–1.2)
Total Protein: 6.9 g/dL (ref 6.5–8.1)

## 2020-12-10 LAB — APTT: aPTT: 39 seconds — ABNORMAL HIGH (ref 24–36)

## 2020-12-10 LAB — PROTIME-INR
INR: 1.1 (ref 0.8–1.2)
Prothrombin Time: 14.4 seconds (ref 11.4–15.2)

## 2020-12-10 LAB — RESP PANEL BY RT-PCR (FLU A&B, COVID) ARPGX2
Influenza A by PCR: NEGATIVE
Influenza B by PCR: NEGATIVE
SARS Coronavirus 2 by RT PCR: POSITIVE — AB

## 2020-12-10 MED ORDER — SODIUM CHLORIDE 0.9 % IV BOLUS
500.0000 mL | Freq: Once | INTRAVENOUS | Status: AC
  Administered 2020-12-10: 500 mL via INTRAVENOUS

## 2020-12-10 NOTE — Discharge Instructions (Signed)
You were seen in the emergency department today with some unsteady gait.  Your MRI did not show evidence of a stroke.  Your kidney function appears weakened but I do not have recent lab work to compare.  We discussed calling your kidney doctor and primary care doctor first thing tomorrow morning to arrange for follow-up blood work in the next 24 to 48 hours.  If your urine color changes or you stop producing urine you should return to the emergency department immediately.

## 2020-12-10 NOTE — ED Provider Notes (Signed)
Emergency Department Provider Note   I have reviewed the triage vital signs and the nursing notes.   HISTORY  Chief Complaint Weakness   HPI Amber Gonzales is a 87 y.o. female with past medical history of diabetes and hypertension presents to the emergency department with gait instability over the past 5 days.  Patient states that she is not feeling especially weak or numb on one side of her body or the other but has noticed that she has been tripping over her feet when walking.  She is had to be much more cautious than she typically is.  She denies any headaches, speech change, difficulty swallowing.  Denies any falls.  She reached out to her primary care doctor and was referred here for evaluation of possible stroke.  No radiation of symptoms or other modifying factors.  No new medications.  She did have COVID 3 to 4 weeks ago but states she took medication for that and made a full recovery.  She has no lingering symptoms.  Past Medical History:  Diagnosis Date  . Diabetes mellitus without complication (HCC)   . Hypertension   . Renal disorder     Patient Active Problem List   Diagnosis Date Noted  . LEG CRAMPS 06/25/2010  . MAMMOGRAM, ABNORMAL, LEFT 03/07/2010  . HAIR LOSS 10/09/2009  . DIABETES MELLITUS, TYPE II 06/06/2009  . NEPHROSCLEROSIS 01/31/2009  . SECONDARY HYPERPARATHYROIDISM 11/01/2008  . GERD 05/20/2007  . OSTEOPOROSIS 05/20/2007  . HYPERLIPIDEMIA 03/31/2007  . HYPERTENSION 03/31/2007  . RENAL DISEASE, CHRONIC, MILD 03/31/2007  . SHOULDER PAIN, RIGHT, HX OF 03/31/2007    History reviewed. No pertinent surgical history.  Allergies Patient has no known allergies.  History reviewed. No pertinent family history.  Social History Social History   Tobacco Use  . Smoking status: Never Smoker  . Smokeless tobacco: Never Used  Substance Use Topics  . Alcohol use: Never    Review of Systems  Constitutional: No fever/chills Eyes: No visual changes. ENT:  No sore throat. Cardiovascular: Denies chest pain. Respiratory: Denies shortness of breath. Gastrointestinal: No abdominal pain.  No nausea, no vomiting.  No diarrhea.  No constipation. Genitourinary: Negative for dysuria. Musculoskeletal: Negative for back pain. Skin: Negative for rash. Neurological: Negative for headaches, focal weakness or numbness. Positive gait instability.   10-point ROS otherwise negative.  ____________________________________________   PHYSICAL EXAM:  VITAL SIGNS: ED Triage Vitals [12/10/20 1539]  Enc Vitals Group     BP (!) 151/71     Pulse Rate 97     Resp 16     Temp 97.9 F (36.6 C)     Temp Source Oral     SpO2 95 %   Constitutional: Alert and oriented. Well appearing and in no acute distress. Eyes: Conjunctivae are normal. Head: Atraumatic. Nose: No congestion/rhinnorhea. Mouth/Throat: Mucous membranes are moist.  Neck: No stridor.  Cardiovascular: Normal rate, regular rhythm. Good peripheral circulation. Grossly normal heart sounds.   Respiratory: Normal respiratory effort.  No retractions. Lungs CTAB. Gastrointestinal: Soft and nontender. No distention.  Musculoskeletal: No lower extremity tenderness nor edema. No gross deformities of extremities. Neurologic:  Normal speech and language. No gross focal neurologic deficits are appreciated. 5/5 strength in the bilateral upper/pwer extremities without numbness. No facial asymmetry. Gait is slightly hesitant and slightly broad-based.  with me but ambulatory with minimal support (hand holding). No foot drop. Skin:  Skin is warm, dry and intact. No rash noted.   ____________________________________________   LABS (all labs  ordered are listed, but only abnormal results are displayed)  Labs Reviewed  RESP PANEL BY RT-PCR (FLU A&B, COVID) ARPGX2 - Abnormal; Notable for the following components:      Result Value   SARS Coronavirus 2 by RT PCR POSITIVE (*)    All other components within normal  limits  APTT - Abnormal; Notable for the following components:   aPTT 39 (*)    All other components within normal limits  CBC - Abnormal; Notable for the following components:   RBC 3.53 (*)    Hemoglobin 11.7 (*)    HCT 35.9 (*)    MCV 101.7 (*)    All other components within normal limits  COMPREHENSIVE METABOLIC PANEL - Abnormal; Notable for the following components:   Potassium 3.3 (*)    Glucose, Bld 152 (*)    BUN 30 (*)    Creatinine, Ser 2.53 (*)    GFR, Estimated 18 (*)    All other components within normal limits  PROTIME-INR  DIFFERENTIAL   ____________________________________________  EKG   EKG Interpretation  Date/Time:  Monday December 10 2020 18:03:49 EDT Ventricular Rate:  88 PR Interval:  162 QRS Duration: 145 QT Interval:  408 QTC Calculation: 494 R Axis:   -77 Text Interpretation: Sinus rhythm Left bundle branch block No old tracing for comparison Confirmed by Alona Bene [phone]) on 12/10/2020 6:15:05 PM Also confirmed by Alona Bene [phone]), editor [place], [place] ([id number])  on 12/11/2020 7:59:13 AM       ____________________________________________  RADIOLOGY  CT HEAD WO CONTRAST  Result Date: 12/10/2020 CLINICAL DATA:  Weakness. EXAM: CT HEAD WITHOUT CONTRAST TECHNIQUE: Contiguous axial images were obtained from the base of the skull through the vertex without intravenous contrast. COMPARISON:  None. FINDINGS: Brain: Mild chronic ischemic white matter disease is noted. No mass effect or midline shift is noted. Ventricular size is within normal limits. There is no evidence of mass lesion, hemorrhage or acute infarction. Vascular: No hyperdense vessel or unexpected calcification. Skull: Normal. Negative for fracture or focal lesion. Sinuses/Orbits: No acute finding. Other: None. IMPRESSION: No acute intracranial abnormality seen. Electronically Signed   By: Lupita Raider M.D.   On: 12/10/2020 16:52     ____________________________________________   PROCEDURES  Procedure(s) performed:   Procedures  None  ____________________________________________   INITIAL IMPRESSION / ASSESSMENT AND PLAN / ED COURSE  Pertinent labs & imaging results that were available during my care of the patient were reviewed by me and considered in my medical decision making (see chart for details).   Patient presents to the emergency department with gait instability over the past 5 to 7 days.  She does have a slightly broad-based and hesitant gait.  No other focal neurologic deficits on exam.  Patient had MSE exam in triage with CT head ordered at that time which is resulted showing no acute process.  No labs or resulted but these are ordered.  Discussed that given her symptoms I would like to perform an MRI to further evaluate for possible stroke.  Patient is in agreement.   Patient's lab work is resulting.  Her COVID PCR remains positive.  Recently recovered from COVID and I suspect this is a residual positive test rather than reinfection.  She is having no symptoms of COVID at this time.  Her creatinine is 2.53 with BUN of 30.  Mild hypokalemia.  CO2 is normal.  I do not have recent lab work to compare with her labs last in our  system from 2012.  I did a search in care everywhere and do not see additional blood work for comparison.  I do see encounters in epic for kidney disease and patient tells me she has a nephrologist.  I suspect these are chronic and not associated with AKI.  We discussed some IV fluids here and possible admission for kidney trending but patient would prefer that if her MRI is normal to return home and call her nephrologist tomorrow for repeat labs this week.   MRI without acute CVA or other finding. Plan for d/c home with PCP/Nephrology follow up.  ____________________________________________  FINAL CLINICAL IMPRESSION(S) / ED DIAGNOSES  Final diagnoses:  Gait instability  Renal  insufficiency     MEDICATIONS GIVEN DURING THIS VISIT:  Medications  sodium chloride 0.9 % bolus 500 mL (0 mLs Intravenous Stopped 12/11/20 0056)    Note:  This document was prepared using Dragon voice recognition software and may include unintentional dictation errors.  Alona Bene, MD, [facility] Emergency Medicine    Awesome Jared, Arlyss Repress, MD 12/11/20 0900

## 2020-12-10 NOTE — ED Notes (Signed)
Patient transported to MRI 

## 2020-12-10 NOTE — ED Provider Notes (Signed)
Emergency Medicine Provider Triage Evaluation Note  Amber Gonzales , a 87 y.o. female  was evaluated in triage.  Pt complains of imbalance issues x1 week.  No other neurodeficits.  Review of Systems  Positive: ataxia Negative: headache  Physical Exam  BP (!) 151/71 (BP Location: Right Arm)   Pulse 97   Temp 97.9 F (36.6 C) (Oral)   Resp 16   SpO2 95%  Gen:   Awake, no distress   Resp:  Normal effort  MSK:   Moves extremities without difficulty  Other:    Medical Decision Making  Medically screening exam initiated at 4:19 PM.  Appropriate orders placed.  Amber Gonzales was informed that the remainder of the evaluation will be completed by another provider, this initial triage assessment does not replace that evaluation, and the importance of remaining in the ED until their evaluation is complete.  Disequilibrium. Work up initiated   Arthor Captain, PA-C 12/10/20 1622    Arby Barrette, MD 12/18/20 2136

## 2020-12-10 NOTE — ED Notes (Signed)
Pt clarified to this RN that she has not been able to stand as long as usual lately & has to go sit down d/t both legs tiring. She endorses that her initial cause for being here is d/t the ocational occurrence of her Right ankle giving out & she tilts to the right. She states it has only caused one fall about 4 weeks ago.

## 2020-12-10 NOTE — ED Triage Notes (Signed)
Pt reports being off balance and feeling unsteady x 1 week. Denies having any recent falls. No unilateral weakness is noted at triage.

## 2020-12-22 ENCOUNTER — Inpatient Hospital Stay (HOSPITAL_BASED_OUTPATIENT_CLINIC_OR_DEPARTMENT_OTHER)
Admission: EM | Admit: 2020-12-22 | Discharge: 2020-12-24 | DRG: 176 | Disposition: A | Discharge status: Home / Self Care | Payer: Medicare Other | Attending: Internal Medicine | Admitting: Internal Medicine

## 2020-12-22 ENCOUNTER — Emergency Department (HOSPITAL_BASED_OUTPATIENT_CLINIC_OR_DEPARTMENT_OTHER): Payer: Medicare Other | Admitting: Radiology

## 2020-12-22 ENCOUNTER — Other Ambulatory Visit: Payer: Self-pay

## 2020-12-22 ENCOUNTER — Emergency Department (HOSPITAL_BASED_OUTPATIENT_CLINIC_OR_DEPARTMENT_OTHER): Payer: Medicare Other

## 2020-12-22 ENCOUNTER — Encounter (HOSPITAL_BASED_OUTPATIENT_CLINIC_OR_DEPARTMENT_OTHER): Payer: Self-pay

## 2020-12-22 DIAGNOSIS — M4854XA Collapsed vertebra, not elsewhere classified, thoracic region, initial encounter for fracture: Secondary | ICD-10-CM | POA: Diagnosis not present

## 2020-12-22 DIAGNOSIS — U099 Post covid-19 condition, unspecified: Secondary | ICD-10-CM | POA: Diagnosis not present

## 2020-12-22 DIAGNOSIS — I2699 Other pulmonary embolism without acute cor pulmonale: Secondary | ICD-10-CM | POA: Diagnosis not present

## 2020-12-22 DIAGNOSIS — M810 Age-related osteoporosis without current pathological fracture: Secondary | ICD-10-CM | POA: Diagnosis not present

## 2020-12-22 DIAGNOSIS — I2694 Multiple subsegmental pulmonary emboli without acute cor pulmonale: Secondary | ICD-10-CM | POA: Diagnosis not present

## 2020-12-22 DIAGNOSIS — S22080A Wedge compression fracture of T11-T12 vertebra, initial encounter for closed fracture: Secondary | ICD-10-CM

## 2020-12-22 DIAGNOSIS — R911 Solitary pulmonary nodule: Secondary | ICD-10-CM

## 2020-12-22 DIAGNOSIS — I129 Hypertensive chronic kidney disease with stage 1 through stage 4 chronic kidney disease, or unspecified chronic kidney disease: Secondary | ICD-10-CM | POA: Diagnosis not present

## 2020-12-22 DIAGNOSIS — N1832 Chronic kidney disease, stage 3b: Secondary | ICD-10-CM | POA: Diagnosis present

## 2020-12-22 DIAGNOSIS — M21371 Foot drop, right foot: Secondary | ICD-10-CM | POA: Diagnosis present

## 2020-12-22 DIAGNOSIS — E785 Hyperlipidemia, unspecified: Secondary | ICD-10-CM | POA: Diagnosis not present

## 2020-12-22 DIAGNOSIS — I2693 Single subsegmental pulmonary embolism without acute cor pulmonale: Secondary | ICD-10-CM | POA: Diagnosis not present

## 2020-12-22 DIAGNOSIS — R109 Unspecified abdominal pain: Secondary | ICD-10-CM | POA: Diagnosis not present

## 2020-12-22 DIAGNOSIS — K5900 Constipation, unspecified: Secondary | ICD-10-CM | POA: Diagnosis not present

## 2020-12-22 DIAGNOSIS — I82441 Acute embolism and thrombosis of right tibial vein: Secondary | ICD-10-CM | POA: Diagnosis present

## 2020-12-22 DIAGNOSIS — E1122 Type 2 diabetes mellitus with diabetic chronic kidney disease: Secondary | ICD-10-CM | POA: Diagnosis present

## 2020-12-22 DIAGNOSIS — N281 Cyst of kidney, acquired: Secondary | ICD-10-CM | POA: Diagnosis not present

## 2020-12-22 DIAGNOSIS — I10 Essential (primary) hypertension: Secondary | ICD-10-CM | POA: Diagnosis not present

## 2020-12-22 DIAGNOSIS — K219 Gastro-esophageal reflux disease without esophagitis: Secondary | ICD-10-CM | POA: Diagnosis not present

## 2020-12-22 LAB — CBC
HCT: 32.6 % — ABNORMAL LOW (ref 36.0–46.0)
Hemoglobin: 10.8 g/dL — ABNORMAL LOW (ref 12.0–15.0)
MCH: 33 pg (ref 26.0–34.0)
MCHC: 33.1 g/dL (ref 30.0–36.0)
MCV: 99.7 fL (ref 80.0–100.0)
Platelets: 214 10*3/uL (ref 150–400)
RBC: 3.27 MIL/uL — ABNORMAL LOW (ref 3.87–5.11)
RDW: 11.8 % (ref 11.5–15.5)
WBC: 8 10*3/uL (ref 4.0–10.5)
nRBC: 0 % (ref 0.0–0.2)

## 2020-12-22 LAB — URINALYSIS, ROUTINE W REFLEX MICROSCOPIC
Bilirubin Urine: NEGATIVE
Glucose, UA: NEGATIVE mg/dL
Hgb urine dipstick: NEGATIVE
Ketones, ur: NEGATIVE mg/dL
Leukocytes,Ua: NEGATIVE
Nitrite: NEGATIVE
Protein, ur: NEGATIVE mg/dL
Specific Gravity, Urine: 1.024 (ref 1.005–1.030)
pH: 6.5 (ref 5.0–8.0)

## 2020-12-22 LAB — COMPREHENSIVE METABOLIC PANEL
ALT: 13 U/L (ref 0–44)
AST: 20 U/L (ref 15–41)
Albumin: 3.5 g/dL (ref 3.5–5.0)
Alkaline Phosphatase: 124 U/L (ref 38–126)
Anion gap: 8 (ref 5–15)
BUN: 24 mg/dL — ABNORMAL HIGH (ref 8–23)
CO2: 29 mmol/L (ref 22–32)
Calcium: 9 mg/dL (ref 8.9–10.3)
Chloride: 102 mmol/L (ref 98–111)
Creatinine, Ser: 1.46 mg/dL — ABNORMAL HIGH (ref 0.44–1.00)
GFR, Estimated: 34 mL/min — ABNORMAL LOW (ref >60)
Glucose, Bld: 124 mg/dL — ABNORMAL HIGH (ref 70–99)
Potassium: 3.9 mmol/L (ref 3.5–5.1)
Sodium: 139 mmol/L (ref 135–145)
Total Bilirubin: 1 mg/dL (ref 0.3–1.2)
Total Protein: 7.1 g/dL (ref 6.5–8.1)

## 2020-12-22 LAB — CBG MONITORING, ED: Glucose-Capillary: 128 mg/dL — ABNORMAL HIGH (ref 70–99)

## 2020-12-22 LAB — TROPONIN I (HIGH SENSITIVITY)
Troponin I (High Sensitivity): 17 ng/L (ref <18)
Troponin I (High Sensitivity): 33 ng/L — ABNORMAL HIGH (ref <18)

## 2020-12-22 LAB — LIPASE, BLOOD: Lipase: 36 U/L (ref 11–51)

## 2020-12-22 LAB — D-DIMER, QUANTITATIVE: D-Dimer, Quant: 4.88 ug/mL-FEU — ABNORMAL HIGH (ref 0.00–0.50)

## 2020-12-22 LAB — HEPARIN LEVEL (UNFRACTIONATED): Heparin Unfractionated: 0.69 IU/mL (ref 0.30–0.70)

## 2020-12-22 MED ORDER — HEPARIN BOLUS VIA INFUSION
3500.0000 [IU] | Freq: Once | INTRAVENOUS | Status: AC
  Administered 2020-12-22: 3500 [IU] via INTRAVENOUS

## 2020-12-22 MED ORDER — ACETAMINOPHEN 325 MG PO TABS
650.0000 mg | ORAL_TABLET | Freq: Four times a day (QID) | ORAL | Status: DC | PRN
  Administered 2020-12-22 – 2020-12-23 (×2): 650 mg via ORAL
  Filled 2020-12-22 (×2): qty 2

## 2020-12-22 MED ORDER — HYDRALAZINE HCL 25 MG PO TABS
25.0000 mg | ORAL_TABLET | Freq: Three times a day (TID) | ORAL | Status: DC
  Administered 2020-12-22 – 2020-12-24 (×5): 25 mg via ORAL
  Filled 2020-12-22 (×6): qty 1

## 2020-12-22 MED ORDER — ACETAMINOPHEN 650 MG RE SUPP: 650.0000 mg | Freq: Four times a day (QID) | RECTAL | Status: DC | PRN

## 2020-12-22 MED ORDER — IOHEXOL 350 MG/ML SOLN
75.0000 mL | Freq: Once | INTRAVENOUS | Status: AC | PRN
  Administered 2020-12-22: 75 mL via INTRAVENOUS

## 2020-12-22 MED ORDER — FUROSEMIDE 40 MG PO TABS: 40.0000 mg | ORAL_TABLET | Freq: Every day | ORAL | Status: DC

## 2020-12-22 MED ORDER — HEPARIN (PORCINE) 25000 UT/250ML-% IV SOLN
900.0000 [IU]/h | INTRAVENOUS | Status: DC
  Administered 2020-12-22: 900 [IU]/h via INTRAVENOUS
  Administered 2020-12-23: 800 [IU]/h via INTRAVENOUS
  Filled 2020-12-22 (×2): qty 250

## 2020-12-22 MED ORDER — ATENOLOL 25 MG PO TABS
50.0000 mg | ORAL_TABLET | Freq: Every day | ORAL | Status: DC
  Administered 2020-12-22 – 2020-12-24 (×3): 50 mg via ORAL
  Filled 2020-12-22 (×3): qty 2

## 2020-12-22 MED ORDER — OXYCODONE HCL 5 MG PO TABS
5.0000 mg | ORAL_TABLET | ORAL | Status: DC | PRN
  Administered 2020-12-22 – 2020-12-23 (×2): 5 mg via ORAL
  Filled 2020-12-22 (×2): qty 1

## 2020-12-22 MED ORDER — BISACODYL 5 MG PO TBEC
5.0000 mg | DELAYED_RELEASE_TABLET | Freq: Every day | ORAL | Status: DC | PRN
Start: 2020-12-22 — End: 2020-12-24

## 2020-12-22 MED ORDER — ASPIRIN 81 MG PO CHEW
324.0000 mg | CHEWABLE_TABLET | Freq: Once | ORAL | Status: AC
  Administered 2020-12-22: 324 mg via ORAL
  Filled 2020-12-22: qty 4

## 2020-12-22 MED ORDER — AMLODIPINE BESYLATE 5 MG PO TABS
5.0000 mg | ORAL_TABLET | Freq: Once | ORAL | Status: AC
  Administered 2020-12-22: 5 mg via ORAL
  Filled 2020-12-22: qty 1

## 2020-12-22 MED ORDER — SODIUM CHLORIDE 0.9 % IV SOLN: INTRAVENOUS | Status: DC

## 2020-12-22 MED ORDER — SENNOSIDES-DOCUSATE SODIUM 8.6-50 MG PO TABS
1.0000 | ORAL_TABLET | Freq: Every evening | ORAL | Status: DC | PRN
  Administered 2020-12-22 – 2020-12-23 (×2): 1 via ORAL
  Filled 2020-12-22 (×2): qty 1

## 2020-12-22 NOTE — ED Notes (Signed)
Attempted iv piv by patty dowd,Rn,x1.

## 2020-12-22 NOTE — ED Triage Notes (Signed)
She c/o being "real achy" all night last night. She denies fever and injury and is in no distress. She has numerous areas of discomfort, mostly at bilat. Flank areas at times radiating toward chest. Her skin is normal, warm and dry and she is breathing normally.

## 2020-12-22 NOTE — Progress Notes (Signed)
ANTICOAGULATION CONSULT NOTE - Initial Consult  Pharmacy Consult for heparin Indication: pulmonary embolus  No Known Allergies  Patient Measurements: Height: 5\' 2"  (157.5 cm) Weight: 54.5 kg (120 lb 1.6 oz) IBW/kg (Calculated) : 50.1 Heparin Dosing Weight: TBW  Vital Signs: Temp: 99.2 F (37.3 C) (06/18 1721) Temp Source: Oral (06/18 0913) BP: 165/86 (06/18 1721) Pulse Rate: 91 (06/18 1721)  Labs: Recent Labs    12/22/20 0957 12/22/20 1922  HGB 10.8*  --   HCT 32.6*  --   PLT 214  --   HEPARINUNFRC  --  0.69  CREATININE 1.46*  --   TROPONINIHS 17  --      Estimated Creatinine Clearance: 21.1 mL/min (A) (by C-G formula based on SCr of 1.46 mg/dL (H)).   Medical History: Past Medical History:  Diagnosis Date   Diabetes mellitus without complication (HCC)    Hypertension    Renal disorder     Assessment: 75 YOF presenting with radiating CP, CT with subseg PE bilaterally, no RHS. Recent COVID dx, she is not on anticoagulation PTA  Heparin level 0.69 units/mL  Goal of Therapy:  Heparin level 0.3-0.7 units/ml Monitor platelets by anticoagulation protocol: Yes   Plan:  Continue heparin 900 units/hr F/u heparin level w/ AM labs tomorrow F/u long term AC plan  98, PharmD, BCCCP Emergency Medicine Clinical Pharmacist  Please check AMION for all [facility] Pharmacy phone numbers After 10:00 PM, call Main Pharmacy [phone]

## 2020-12-22 NOTE — H&P (Addendum)
History and Physical    Amber Gonzales Pankonin ZOX:[id number] DOB: 03/17/1930 DOA: 12/22/2020  PCP: Pcp, No (Confirm with patient/family/NH records and if not entered, [facility] Patient coming from: Home  I have personally briefly reviewed patient[facility]  Chief Complaint: Chest pain  HPI: Amber Gonzales Velez is a 87 y.o. female with medical history significant of HTN, IIDM, CKD stage III, with diet control, recent COVID infection, presented with worsening of chest pains.  Patient had COVID infection last month and the only symptoms she had was headache and malaise, and she was treated on 5/23 of Molnupiravir for 5 days. Since then she has had on and off dull like chest pain and flank pain bilaterally, worsening with cough and deep breath.  Chest pain became worse for the last week and last night chest pain became unbearable 10/10, associated with shortness of breath, no cough no fever chills no leg pains.  No history of clotting problems, no history of CAD MI or CVA.  She is a non-smoker.  ED Course: CT angiogram showed bilateral subsegmental and segmental PE, no signs of right heart strain.  Blood pressure elevated, no hypoxia.  Heparin drip started at [facility].  Review of Systems: As per HPI otherwise 14 point review of systems negative.    Past Medical History:  Diagnosis Date   Diabetes mellitus without complication (HCC)    Hypertension    Renal disorder     No past surgical history on file.   reports that she has never smoked. She has never used smokeless tobacco. She reports that she does not drink alcohol. No history on file for drug use.  No Known Allergies  No family history on file.   Prior to Admission medications   Medication Sig Start Date End Date Taking? Authorizing Provider  methocarbamol (ROBAXIN) 500 MG tablet Take 1 tablet (500 mg total) by mouth every 8 (eight) hours as needed (muscle spasm/pain).  10/29/20   Cathren LaineSteinl, Kevin, MD  Molnupiravir 200 MG CAPS Take 4 capsules (800 mg total) by mouth every 12 (twelve) hours for 5 days. 11/26/20       Physical Exam: Vitals:   12/22/20 1208 12/22/20 1353 12/22/20 1400 12/22/20 1500  BP: (!) 195/98 (!) 153/83 (!) 160/71 (!) 123/99  Pulse: 86 92  89  Resp: (!) 22 20 (!) 22 (!) 24  Temp:      TempSrc:      SpO2: 98% 97%  98%    Constitutional: NAD, calm, comfortable Vitals:   12/22/20 1208 12/22/20 1353 12/22/20 1400 12/22/20 1500  BP: (!) 195/98 (!) 153/83 (!) 160/71 (!) 123/99  Pulse: 86 92  89  Resp: (!) 22 20 (!) 22 (!) 24  Temp:      TempSrc:      SpO2: 98% 97%  98%   Eyes: PERRL, lids and conjunctivae normal ENMT: Mucous membranes are moist. Posterior pharynx clear of any exudate or lesions.Normal dentition.  Neck: normal, supple, no masses, no thyromegaly Respiratory: clear to auscultation bilaterally, no wheezing, no crackles. Normal respiratory effort. No accessory muscle use.  Cardiovascular: Regular rate and rhythm, no murmurs / rubs / gallops. No extremity edema. 2+ pedal pulses. No carotid bruits.  Abdomen: no tenderness, no masses palpated. No hepatosplenomegaly. Bowel sounds positive.  Musculoskeletal: no clubbing / cyanosis. No joint deformity upper and lower extremities. Good ROM, no contractures. Normal muscle tone.  Skin: no rashes, lesions,  ulcers. No induration Neurologic: CN 2-12 grossly intact. Sensation intact, DTR normal. Strength 5/5 in all 4.  Psychiatric: Normal judgment and insight. Alert and oriented x 3. Normal mood.    Labs on Admission: I have personally reviewed following labs and imaging studies  CBC: Recent Labs  Lab 12/22/20 0957  WBC 8.0  HGB 10.8*  HCT 32.6*  MCV 99.7  PLT 214   Basic Metabolic Panel: Recent Labs  Lab 12/22/20 0957  NA 139  K 3.9  CL 102  CO2 29  GLUCOSE 124*  BUN 24*  CREATININE 1.46*  CALCIUM 9.0   GFR: CrCl cannot be calculated (Unknown ideal  weight.). Liver Function Tests: Recent Labs  Lab 12/22/20 0957  AST 20  ALT 13  ALKPHOS 124  BILITOT 1.0  PROT 7.1  ALBUMIN 3.5   Recent Labs  Lab 12/22/20 0957  LIPASE 36   No results for input(s): AMMONIA in the last 168 hours. Coagulation Profile: No results for input(s): INR, PROTIME in the last 168 hours. Cardiac Enzymes: No results for input(s): CKTOTAL, CKMB, CKMBINDEX, TROPONINI in the last 168 hours. BNP (last 3 results) No results for input(s): PROBNP in the last 8760 hours. HbA1C: No results for input(s): HGBA1C in the last 72 hours. CBG: Recent Labs  Lab 12/22/20 1004  GLUCAP 128*   Lipid Profile: No results for input(s): CHOL, HDL, LDLCALC, TRIG, CHOLHDL, LDLDIRECT in the last 72 hours. Thyroid Function Tests: No results for input(s): TSH, T4TOTAL, FREET4, T3FREE, THYROIDAB in the last 72 hours. Anemia Panel: No results for input(s): VITAMINB12, FOLATE, FERRITIN, TIBC, IRON, RETICCTPCT in the last 72 hours. Urine analysis:    Component Value Date/Time   COLORURINE COLORLESS (A) 12/22/2020 1206   APPEARANCEUR CLEAR 12/22/2020 1206   LABSPEC 1.024 12/22/2020 1206   PHURINE 6.5 12/22/2020 1206   GLUCOSEU NEGATIVE 12/22/2020 1206   HGBUR NEGATIVE 12/22/2020 1206   HGBUR negative 05/09/2008 0837   BILIRUBINUR NEGATIVE 12/22/2020 1206   KETONESUR NEGATIVE 12/22/2020 1206   PROTEINUR NEGATIVE 12/22/2020 1206   UROBILINOGEN 0.2 05/09/2008 0837   NITRITE NEGATIVE 12/22/2020 1206   LEUKOCYTESUR NEGATIVE 12/22/2020 1206    Radiological Exams on Admission: CT Angio Chest PE W and/or Wo Contrast  Result Date: 12/22/2020 CLINICAL DATA:  Patient with discomfort.  Flank pain.  Achiness. EXAM: CT ANGIOGRAPHY CHEST CT ABDOMEN AND PELVIS WITH CONTRAST TECHNIQUE: Multidetector CT imaging of the chest was performed using the standard protocol during bolus administration of intravenous contrast. Multiplanar CT image reconstructions and MIPs were obtained to evaluate  the vascular anatomy. Multidetector CT imaging of the abdomen and pelvis was performed using the standard protocol during bolus administration of intravenous contrast. CONTRAST:  57mL OMNIPAQUE IOHEXOL 350 MG/ML SOLN COMPARISON:  None. FINDINGS: CTA CHEST FINDINGS Cardiovascular: Heart is mildly enlarged. Trace fluid superior pericardial recess. Thoracic aortic vascular calcifications. Coronary arterial vascular calcifications. Adequate opacification of the pulmonary arterial system. Intraluminal filling defects are demonstrated within the segmental and subsegmental pulmonary arteries of the left upper, left lower and right lower lobes. Additionally pulmonary embolus demonstrated within the right middle lobe subsegmental pulmonary arteries. No CT evidence to suggest right heart strain. Mediastinum/Nodes: No enlarged axillary, mediastinal or hilar lymphadenopathy. Normal appearance of the esophagus. Lungs/Pleura: Central airways are patent. Patchy ground-glass and consolidative opacities demonstrated within the lingula and bilateral lower lobes. No definite pleural effusion or pneumothorax. 3 mm subpleural right upper lobe nodule (image 68; series 6). Small left pleural effusion. Musculoskeletal: Thoracic spine degenerative changes. Redemonstrated  T12 compression fracture with associated sclerosis. Review of the MIP images confirms the above findings. CT ABDOMEN and PELVIS FINDINGS Hepatobiliary: The liver is normal in size and contour. No focal hepatic lesion is identified. Gallbladder is unremarkable. No intrahepatic or extrahepatic biliary ductal dilatation. Pancreas: Unremarkable Spleen: Unremarkable Adrenals/Urinary Tract: Normal adrenal glands. Kidneys enhance symmetrically with contrast. There is a 1.9 cm partially exophytic cyst off the superior pole of left kidney. Urinary bladder is unremarkable. Stomach/Bowel: No abnormal bowel wall thickening or evidence for bowel obstruction. No free fluid or free  intraperitoneal air. Vascular/Lymphatic: Normal caliber abdominal aorta. Peripheral calcified atherosclerotic plaque. Reproductive: Uterus and adnexal structures are unremarkable. Other: None. Musculoskeletal: Lumbar spine degenerative changes. No aggressive or acute appearing osseous lesions. Review of the MIP images confirms the above findings. IMPRESSION: Findings compatible with acute segmental and subsegmental pulmonary embolus bilaterally. No CT evidence to suggest acute right heart strain. Age indeterminate likely chronic T12 compression deformity. No acute process within the abdomen or pelvis. 3 mm nodule right upper lobe. No follow-up needed if patient is low-risk. Non-contrast chest CT can be considered in 12 months if patient is high-risk. This recommendation follows the consensus statement: Guidelines for Management of Incidental Pulmonary Nodules Detected on CT Images: From the Fleischner Society 2017; Radiology 2017; 284:228-243. Critical Value/emergent results were called by telephone at the time of interpretation on 12/22/2020 at 12:30 pm to provider [facility] , who verbally acknowledged these results. Electronically Signed   By: Annia Belt Gonzales.D.   On: 12/22/2020 12:34   CT ABDOMEN PELVIS W CONTRAST  Result Date: 12/22/2020 CLINICAL DATA:  Patient with discomfort.  Flank pain.  Achiness. EXAM: CT ANGIOGRAPHY CHEST CT ABDOMEN AND PELVIS WITH CONTRAST TECHNIQUE: Multidetector CT imaging of the chest was performed using the standard protocol during bolus administration of intravenous contrast. Multiplanar CT image reconstructions and MIPs were obtained to evaluate the vascular anatomy. Multidetector CT imaging of the abdomen and pelvis was performed using the standard protocol during bolus administration of intravenous contrast. CONTRAST:  62mL OMNIPAQUE IOHEXOL 350 MG/ML SOLN COMPARISON:  None. FINDINGS: CTA CHEST FINDINGS Cardiovascular: Heart is mildly enlarged. Trace fluid superior pericardial  recess. Thoracic aortic vascular calcifications. Coronary arterial vascular calcifications. Adequate opacification of the pulmonary arterial system. Intraluminal filling defects are demonstrated within the segmental and subsegmental pulmonary arteries of the left upper, left lower and right lower lobes. Additionally pulmonary embolus demonstrated within the right middle lobe subsegmental pulmonary arteries. No CT evidence to suggest right heart strain. Mediastinum/Nodes: No enlarged axillary, mediastinal or hilar lymphadenopathy. Normal appearance of the esophagus. Lungs/Pleura: Central airways are patent. Patchy ground-glass and consolidative opacities demonstrated within the lingula and bilateral lower lobes. No definite pleural effusion or pneumothorax. 3 mm subpleural right upper lobe nodule (image 68; series 6). Small left pleural effusion. Musculoskeletal: Thoracic spine degenerative changes. Redemonstrated T12 compression fracture with associated sclerosis. Review of the MIP images confirms the above findings. CT ABDOMEN and PELVIS FINDINGS Hepatobiliary: The liver is normal in size and contour. No focal hepatic lesion is identified. Gallbladder is unremarkable. No intrahepatic or extrahepatic biliary ductal dilatation. Pancreas: Unremarkable Spleen: Unremarkable Adrenals/Urinary Tract: Normal adrenal glands. Kidneys enhance symmetrically with contrast. There is a 1.9 cm partially exophytic cyst off the superior pole of left kidney. Urinary bladder is unremarkable. Stomach/Bowel: No abnormal bowel wall thickening or evidence for bowel obstruction. No free fluid or free intraperitoneal air. Vascular/Lymphatic: Normal caliber abdominal aorta. Peripheral calcified atherosclerotic plaque. Reproductive: Uterus and adnexal structures are unremarkable.  Other: None. Musculoskeletal: Lumbar spine degenerative changes. No aggressive or acute appearing osseous lesions. Review of the MIP images confirms the above  findings. IMPRESSION: Findings compatible with acute segmental and subsegmental pulmonary embolus bilaterally. No CT evidence to suggest acute right heart strain. Age indeterminate likely chronic T12 compression deformity. No acute process within the abdomen or pelvis. 3 mm nodule right upper lobe. No follow-up needed if patient is low-risk. Non-contrast chest CT can be considered in 12 months if patient is high-risk. This recommendation follows the consensus statement: Guidelines for Management of Incidental Pulmonary Nodules Detected on CT Images: From the Fleischner Society 2017; Radiology 2017; 284:228-243. Critical Value/emergent results were called by telephone at the time of interpretation on 12/22/2020 at 12:30 pm to provider [facility] , who verbally acknowledged these results. Electronically Signed   By: Annia Belt Gonzales.D.   On: 12/22/2020 12:34   DG Abdomen Acute W/Chest  Result Date: 12/22/2020 CLINICAL DATA:  Chest pain EXAM: DG ABDOMEN ACUTE WITH 1 VIEW CHEST COMPARISON:  None. FINDINGS: Heart size and vascularity normal.  Atherosclerotic aortic arch. Elevated left hemidiaphragm with mild left lower lobe atelectasis. Right lung is clear. Normal bowel gas pattern. No bowel obstruction or free air. Dense calcification overlying the right sacrum of uncertain etiology. Doubt kidney stone. No acute skeletal abnormality. IMPRESSION: Left lower lobe atelectasis Normal bowel gas pattern Electronically Signed   By: Marlan Palau Gonzales.D.   On: 12/22/2020 10:53    EKG: Independently reviewed. Chronic LBBB  Assessment/Plan Active Problems:   Pulmonary emboli (HCC)  (please populate well all problems here in Problem List. (For example, if patient is on BP meds at home and you resume or decide to hold them, it is a problem that needs to be her. Same for CAD, COPD, HLD and so on)  PE,, submental and sup supplemental -Provoked from PE.  Heparin drip bridging for renal dosed Eliquis on discharge.  Expect at  least 3 months of anticoagulation. -Vital signs stable no hypoxia, ordered DVT study and echocardiogram.  Troponin negative x1. -Symptomatic management with incentive spirometry.  CKD stage III -On continuous IV fluids to prevent IV contrast nephropathy -Hold Lasix  T12 compression fracture -Check vitamin D level -TLSO if indicated.  HTN uncontrolled -Resume home BP meds, hold Lasix as above.  IIDM -Continue diet control.  DVT prophylaxis: Heparin drip Code Status: Full code Family Communication: Son and his family Disposition Plan: Expect 1 to 2 days hospital stay Consults called: None Admission status: Telemetry admission   Emeline General MD [facility] Pager [phone]  12/22/2020, 5:16 PM

## 2020-12-22 NOTE — ED Notes (Signed)
ekd printed and given to [facility] MD.

## 2020-12-22 NOTE — ED Provider Notes (Addendum)
[facility] EMERGENCY DEPT Provider Note   CSN: [id number] Arrival date & time: 12/22/20  0856     History Chief Complaint  Patient presents with   Generalized Body Aches    Amber Gonzales is a 87 y.o. female.  HPI  Patient presented to the ED for evaluation of pain on the left side of her chest and abdomen that started last evening.  She states she started feeling achy last night.  She has had some discomfort in both flank areas but mostly on the left side now and it goes up towards her chest.  She also has noticed she has had some issues with constipation and tried taking some laxatives but still only had small hard stools.  She has not had any fevers.  She has not had any vomiting.  She is not having any shortness of breath.  Patient denies any dysuria  Past Medical History:  Diagnosis Date   Diabetes mellitus without complication (HCC)    Hypertension    Renal disorder     Patient Active Problem List   Diagnosis Date Noted   LEG CRAMPS 06/25/2010   MAMMOGRAM, ABNORMAL, LEFT 03/07/2010   HAIR LOSS 10/09/2009   DIABETES MELLITUS, TYPE II 06/06/2009   NEPHROSCLEROSIS 01/31/2009   SECONDARY HYPERPARATHYROIDISM 11/01/2008   GERD 05/20/2007   OSTEOPOROSIS 05/20/2007   HYPERLIPIDEMIA 03/31/2007   HYPERTENSION 03/31/2007   RENAL DISEASE, CHRONIC, MILD 03/31/2007   SHOULDER PAIN, RIGHT, HX OF 03/31/2007    No past surgical history on file.   OB History   No obstetric history on file.     No family history on file.  Social History   Tobacco Use   Smoking status: Never   Smokeless tobacco: Never  Substance Use Topics   Alcohol use: Never    Home Medications Prior to Admission medications   Medication Sig Start Date End Date Taking? Authorizing Provider  methocarbamol (ROBAXIN) 500 MG tablet Take 1 tablet (500 mg total) by mouth every 8 (eight) hours as needed (muscle spasm/pain). 10/29/20   Cathren Laine, MD  Molnupiravir 200 MG CAPS Take 4  capsules (800 mg total) by mouth every 12 (twelve) hours for 5 days. 11/26/20       Allergies    Patient has no known allergies.  Review of Systems   Review of Systems  Neurological:        Family has noticed a foot drop on the right foot ongoing for a while, they state she did have MRI testing at some point  All other systems reviewed and are negative.  Physical Exam Updated Vital Signs BP (!) 195/98 (BP Location: Right Arm)   Pulse 86   Temp 99.1 F (37.3 C) (Oral)   Resp (!) 22   SpO2 98%   Physical Exam Vitals and nursing note reviewed.  Constitutional:      Appearance: She is well-developed.     Comments: Elderly, frail  HENT:     Head: Normocephalic and atraumatic.     Right Ear: External ear normal.     Left Ear: External ear normal.  Eyes:     General: No scleral icterus.       Right eye: No discharge.        Left eye: No discharge.     Conjunctiva/sclera: Conjunctivae normal.  Neck:     Trachea: No tracheal deviation.  Cardiovascular:     Rate and Rhythm: Normal rate and regular rhythm.  Pulmonary:  Effort: Pulmonary effort is normal. No respiratory distress.     Breath sounds: Normal breath sounds. No stridor. No wheezing or rales.  Abdominal:     General: Bowel sounds are normal. There is no distension.     Palpations: Abdomen is soft.     Tenderness: There is abdominal tenderness. There is no guarding or rebound.     Comments: Mild tenderness palpation left upper quadrant, no guarding  Musculoskeletal:        General: No tenderness or deformity.     Cervical back: Neck supple.  Skin:    General: Skin is warm and dry.     Findings: No rash.  Neurological:     Mental Status: She is alert.     Cranial Nerves: No cranial nerve deficit (no facial droop, extraocular movements intact, no slurred speech).     Sensory: No sensory deficit.     Motor: No abnormal muscle tone or seizure activity.     Coordination: Coordination normal.     Comments: No  difficulty lifting her leg off the bed, patient does have difficulty with dorsi flexion of the right foot  Psychiatric:        Mood and Affect: Mood normal.    ED Results / Procedures / Treatments   Labs (all labs ordered are listed, but only abnormal results are displayed) Labs Reviewed  CBC - Abnormal; Notable for the following components:      Result Value   RBC 3.27 (*)    Hemoglobin 10.8 (*)    HCT 32.6 (*)    All other components within normal limits  D-DIMER, QUANTITATIVE - Abnormal; Notable for the following components:   D-Dimer, Quant 4.88 (*)    All other components within normal limits  COMPREHENSIVE METABOLIC PANEL - Abnormal; Notable for the following components:   Glucose, Bld 124 (*)    BUN 24 (*)    Creatinine, Ser 1.46 (*)    GFR, Estimated 34 (*)    All other components within normal limits  URINALYSIS, ROUTINE W REFLEX MICROSCOPIC - Abnormal; Notable for the following components:   Color, Urine COLORLESS (*)    All other components within normal limits  CBG MONITORING, ED - Abnormal; Notable for the following components:   Glucose-Capillary 128 (*)    All other components within normal limits  LIPASE, BLOOD  TROPONIN I (HIGH SENSITIVITY)  TROPONIN I (HIGH SENSITIVITY)    EKG EKG Interpretation  Date/Time:  Saturday December 22 2020 09:10:17 EDT Ventricular Rate:  92 PR Interval:  165 QRS Duration: 139 QT Interval:  389 QTC Calculation: 482 R Axis:   -47 Text Interpretation: Sinus rhythm Left bundle branch block No significant change since last tracing Confirmed by Linwood Dibbles [phone] on 12/22/2020 9:40:44 AM  Radiology CT Angio Chest PE W and/or Wo Contrast  Result Date: 12/22/2020 CLINICAL DATA:  Patient with discomfort.  Flank pain.  Achiness. EXAM: CT ANGIOGRAPHY CHEST CT ABDOMEN AND PELVIS WITH CONTRAST TECHNIQUE: Multidetector CT imaging of the chest was performed using the standard protocol during bolus administration of intravenous contrast.  Multiplanar CT image reconstructions and MIPs were obtained to evaluate the vascular anatomy. Multidetector CT imaging of the abdomen and pelvis was performed using the standard protocol during bolus administration of intravenous contrast. CONTRAST:  10mL OMNIPAQUE IOHEXOL 350 MG/ML SOLN COMPARISON:  None. FINDINGS: CTA CHEST FINDINGS Cardiovascular: Heart is mildly enlarged. Trace fluid superior pericardial recess. Thoracic aortic vascular calcifications. Coronary arterial vascular calcifications. Adequate opacification of the  pulmonary arterial system. Intraluminal filling defects are demonstrated within the segmental and subsegmental pulmonary arteries of the left upper, left lower and right lower lobes. Additionally pulmonary embolus demonstrated within the right middle lobe subsegmental pulmonary arteries. No CT evidence to suggest right heart strain. Mediastinum/Nodes: No enlarged axillary, mediastinal or hilar lymphadenopathy. Normal appearance of the esophagus. Lungs/Pleura: Central airways are patent. Patchy ground-glass and consolidative opacities demonstrated within the lingula and bilateral lower lobes. No definite pleural effusion or pneumothorax. 3 mm subpleural right upper lobe nodule (image 68; series 6). Small left pleural effusion. Musculoskeletal: Thoracic spine degenerative changes. Redemonstrated T12 compression fracture with associated sclerosis. Review of the MIP images confirms the above findings. CT ABDOMEN and PELVIS FINDINGS Hepatobiliary: The liver is normal in size and contour. No focal hepatic lesion is identified. Gallbladder is unremarkable. No intrahepatic or extrahepatic biliary ductal dilatation. Pancreas: Unremarkable Spleen: Unremarkable Adrenals/Urinary Tract: Normal adrenal glands. Kidneys enhance symmetrically with contrast. There is a 1.9 cm partially exophytic cyst off the superior pole of left kidney. Urinary bladder is unremarkable. Stomach/Bowel: No abnormal bowel wall  thickening or evidence for bowel obstruction. No free fluid or free intraperitoneal air. Vascular/Lymphatic: Normal caliber abdominal aorta. Peripheral calcified atherosclerotic plaque. Reproductive: Uterus and adnexal structures are unremarkable. Other: None. Musculoskeletal: Lumbar spine degenerative changes. No aggressive or acute appearing osseous lesions. Review of the MIP images confirms the above findings. IMPRESSION: Findings compatible with acute segmental and subsegmental pulmonary embolus bilaterally. No CT evidence to suggest acute right heart strain. Age indeterminate likely chronic T12 compression deformity. No acute process within the abdomen or pelvis. 3 mm nodule right upper lobe. No follow-up needed if patient is low-risk. Non-contrast chest CT can be considered in 12 months if patient is high-risk. This recommendation follows the consensus statement: Guidelines for Management of Incidental Pulmonary Nodules Detected on CT Images: From the Fleischner Society 2017; Radiology 2017; 284:228-243. Critical Value/emergent results were called by telephone at the time of interpretation on 12/22/2020 at 12:30 pm to provider [facility] , who verbally acknowledged these results. Electronically Signed   By: Annia Belt M.D.   On: 12/22/2020 12:34   CT ABDOMEN PELVIS W CONTRAST  Result Date: 12/22/2020 CLINICAL DATA:  Patient with discomfort.  Flank pain.  Achiness. EXAM: CT ANGIOGRAPHY CHEST CT ABDOMEN AND PELVIS WITH CONTRAST TECHNIQUE: Multidetector CT imaging of the chest was performed using the standard protocol during bolus administration of intravenous contrast. Multiplanar CT image reconstructions and MIPs were obtained to evaluate the vascular anatomy. Multidetector CT imaging of the abdomen and pelvis was performed using the standard protocol during bolus administration of intravenous contrast. CONTRAST:  75mL OMNIPAQUE IOHEXOL 350 MG/ML SOLN COMPARISON:  None. FINDINGS: CTA CHEST FINDINGS  Cardiovascular: Heart is mildly enlarged. Trace fluid superior pericardial recess. Thoracic aortic vascular calcifications. Coronary arterial vascular calcifications. Adequate opacification of the pulmonary arterial system. Intraluminal filling defects are demonstrated within the segmental and subsegmental pulmonary arteries of the left upper, left lower and right lower lobes. Additionally pulmonary embolus demonstrated within the right middle lobe subsegmental pulmonary arteries. No CT evidence to suggest right heart strain. Mediastinum/Nodes: No enlarged axillary, mediastinal or hilar lymphadenopathy. Normal appearance of the esophagus. Lungs/Pleura: Central airways are patent. Patchy ground-glass and consolidative opacities demonstrated within the lingula and bilateral lower lobes. No definite pleural effusion or pneumothorax. 3 mm subpleural right upper lobe nodule (image 68; series 6). Small left pleural effusion. Musculoskeletal: Thoracic spine degenerative changes. Redemonstrated T12 compression fracture with associated sclerosis. Review of the  MIP images confirms the above findings. CT ABDOMEN and PELVIS FINDINGS Hepatobiliary: The liver is normal in size and contour. No focal hepatic lesion is identified. Gallbladder is unremarkable. No intrahepatic or extrahepatic biliary ductal dilatation. Pancreas: Unremarkable Spleen: Unremarkable Adrenals/Urinary Tract: Normal adrenal glands. Kidneys enhance symmetrically with contrast. There is a 1.9 cm partially exophytic cyst off the superior pole of left kidney. Urinary bladder is unremarkable. Stomach/Bowel: No abnormal bowel wall thickening or evidence for bowel obstruction. No free fluid or free intraperitoneal air. Vascular/Lymphatic: Normal caliber abdominal aorta. Peripheral calcified atherosclerotic plaque. Reproductive: Uterus and adnexal structures are unremarkable. Other: None. Musculoskeletal: Lumbar spine degenerative changes. No aggressive or acute  appearing osseous lesions. Review of the MIP images confirms the above findings. IMPRESSION: Findings compatible with acute segmental and subsegmental pulmonary embolus bilaterally. No CT evidence to suggest acute right heart strain. Age indeterminate likely chronic T12 compression deformity. No acute process within the abdomen or pelvis. 3 mm nodule right upper lobe. No follow-up needed if patient is low-risk. Non-contrast chest CT can be considered in 12 months if patient is high-risk. This recommendation follows the consensus statement: Guidelines for Management of Incidental Pulmonary Nodules Detected on CT Images: From the Fleischner Society 2017; Radiology 2017; 284:228-243. Critical Value/emergent results were called by telephone at the time of interpretation on 12/22/2020 at 12:30 pm to provider [facility] , who verbally acknowledged these results. Electronically Signed   By: Annia Belt M.D.   On: 12/22/2020 12:34   DG Abdomen Acute W/Chest  Result Date: 12/22/2020 CLINICAL DATA:  Chest pain EXAM: DG ABDOMEN ACUTE WITH 1 VIEW CHEST COMPARISON:  None. FINDINGS: Heart size and vascularity normal.  Atherosclerotic aortic arch. Elevated left hemidiaphragm with mild left lower lobe atelectasis. Right lung is clear. Normal bowel gas pattern. No bowel obstruction or free air. Dense calcification overlying the right sacrum of uncertain etiology. Doubt kidney stone. No acute skeletal abnormality. IMPRESSION: Left lower lobe atelectasis Normal bowel gas pattern Electronically Signed   By: Marlan Palau M.D.   On: 12/22/2020 10:53    Procedures Procedures   Medications Ordered in ED Medications  0.9 %  sodium chloride infusion ( Intravenous New Bag/Given 12/22/20 1029)  amLODipine (NORVASC) tablet 5 mg (has no administration in time range)  aspirin chewable tablet 324 mg (324 mg Oral Given 12/22/20 1025)  iohexol (OMNIPAQUE) 350 MG/ML injection 75 mL (75 mLs Intravenous Contrast Given 12/22/20 1126)    ED  Course  I have reviewed the triage vital signs and the nursing notes.  Pertinent labs & imaging results that were available during my care of the patient were reviewed by me and considered in my medical decision making (see chart for details).  Clinical Course as of 12/22/20 1306  Sat Dec 22, 2020  5427 Positive for COVID 11 days ago per records [JK]  0944 Patient did have an MRI of the brain on June 6 of this year.  Negative for acute stroke [JK]  1103 D-dimer elevated at 4.88 [JK]  1103 Chest x-ray shows left lower lobe atelectasis [JK]  1235 Received verbal report on CT scan.  Pt has bilateral PEs. [JK]  1258 CT scan did show indeterminate T12 thoracic spine compression fracture.  Could be related to some of her pain she has experienced.  Incidental pulmonary nodule also noted.  Patient is overall low risk [JK]  1306 Discussed with Dr Chipper Herb [JK]    Clinical Course User Index [JK] Linwood Dibbles, MD   MDM Rules/Calculators/A&P  Patient presented to the ED for evaluation of flank pain as well as pain in her left upper quadrant.  Patient did have COVID recently.  She had a positive test 12 days ago.  Concerned about the possibility of diverticulitis versus pneumonia versus NSTEMI.  ED work-up showed initial normal troponin however D-dimer was significantly elevated.  CT scan was performed and it does show evidence of pulmonary embolism.  No acute abdominal process noted.  Patient is not showing evidence of heart strain.  She is hemodynamically stable and is in fact hypertensive.  Will give a dose of Norvasc.  IV heparin ordered.  I will consult the medical service for admission and further treatment.  Findings and plan discussed with the patient and her daughter Final Clinical Impression(s) / ED Diagnoses Final diagnoses:  Multiple subsegmental pulmonary emboli without acute cor pulmonale (HCC)  Pulmonary nodule  Compression fracture of T12 vertebra, initial encounter  (HCC)     Linwood DibblesKnapp, Salim Forero, MD 12/22/20 1259    Linwood DibblesKnapp, Jaquese Irving, MD 12/22/20 1306

## 2020-12-22 NOTE — ED Notes (Signed)
Called [phone] for report,nurse to return call.Melodye Ped

## 2020-12-22 NOTE — ED Notes (Signed)
As I write this, she is in CT. 

## 2020-12-22 NOTE — ED Notes (Signed)
Attempted piv left/right ac unssuccessful

## 2020-12-22 NOTE — ED Notes (Signed)
Pt 's son Lucindy Borel called and made aware pt is being transported currently to [facility] 2w31. Pt 's clothing,shoes and purse are with her.

## 2020-12-22 NOTE — ED Notes (Signed)
Report called to [facility] on 2000, [place]

## 2020-12-22 NOTE — Progress Notes (Signed)
ANTICOAGULATION CONSULT NOTE - Initial Consult  Pharmacy Consult for heparin Indication: pulmonary embolus  No Known Allergies  Patient Measurements:   Heparin Dosing Weight: TBW  Vital Signs: Temp: 99.1 F (37.3 C) (06/18 0913) Temp Source: Oral (06/18 0913) BP: 195/98 (06/18 1208) Pulse Rate: 86 (06/18 1208)  Labs: Recent Labs    12/22/20 0957  HGB 10.8*  HCT 32.6*  PLT 214  CREATININE 1.46*  TROPONINIHS 17    CrCl cannot be calculated (Unknown ideal weight.).   Medical History: Past Medical History:  Diagnosis Date   Diabetes mellitus without complication (HCC)    Hypertension    Renal disorder     Assessment: 30 YOF presenting with radiating CP, CT with subseg PE bilaterally, no RHS. Recent COVID dx, she is not on anticoagulation PTA  Goal of Therapy:  Heparin level 0.3-0.7 units/ml Monitor platelets by anticoagulation protocol: Yes   Plan:  Heparin 3500 units IV x 1, and gtt at 900 units/hr F/u 8 hour heparin level F/u long term [facility] plan  Daylene Posey, PharmD Clinical Pharmacist ED Pharmacist Phone # [phone] 12/22/2020 12:57 PM

## 2020-12-23 ENCOUNTER — Inpatient Hospital Stay (HOSPITAL_COMMUNITY): Payer: Medicare Other

## 2020-12-23 DIAGNOSIS — R911 Solitary pulmonary nodule: Secondary | ICD-10-CM

## 2020-12-23 DIAGNOSIS — I2699 Other pulmonary embolism without acute cor pulmonale: Secondary | ICD-10-CM | POA: Diagnosis not present

## 2020-12-23 DIAGNOSIS — I2694 Multiple subsegmental pulmonary emboli without acute cor pulmonale: Secondary | ICD-10-CM | POA: Diagnosis not present

## 2020-12-23 LAB — CBC
HCT: 32.5 % — ABNORMAL LOW (ref 36.0–46.0)
Hemoglobin: 10.7 g/dL — ABNORMAL LOW (ref 12.0–15.0)
MCH: 33.3 pg (ref 26.0–34.0)
MCHC: 32.9 g/dL (ref 30.0–36.0)
MCV: 101.2 fL — ABNORMAL HIGH (ref 80.0–100.0)
Platelets: 251 10*3/uL (ref 150–400)
RBC: 3.21 MIL/uL — ABNORMAL LOW (ref 3.87–5.11)
RDW: 11.8 % (ref 11.5–15.5)
WBC: 10.6 10*3/uL — ABNORMAL HIGH (ref 4.0–10.5)
nRBC: 0 % (ref 0.0–0.2)

## 2020-12-23 LAB — HEPARIN LEVEL (UNFRACTIONATED)
Heparin Unfractionated: 0.88 IU/mL — ABNORMAL HIGH (ref 0.30–0.70)
Heparin Unfractionated: 0.33 IU/mL (ref 0.30–0.70)
Heparin Unfractionated: 0.25 IU/mL — ABNORMAL LOW (ref 0.30–0.70)

## 2020-12-23 LAB — BASIC METABOLIC PANEL
Anion gap: 10 (ref 5–15)
BUN: 16 mg/dL (ref 8–23)
CO2: 23 mmol/L (ref 22–32)
Calcium: 8.4 mg/dL — ABNORMAL LOW (ref 8.9–10.3)
Chloride: 104 mmol/L (ref 98–111)
Creatinine, Ser: 1.18 mg/dL — ABNORMAL HIGH (ref 0.44–1.00)
GFR, Estimated: 44 mL/min — ABNORMAL LOW (ref >60)
Glucose, Bld: 117 mg/dL — ABNORMAL HIGH (ref 70–99)
Potassium: 3.8 mmol/L (ref 3.5–5.1)
Sodium: 137 mmol/L (ref 135–145)

## 2020-12-23 LAB — GLUCOSE, CAPILLARY: Glucose-Capillary: 143 mg/dL — ABNORMAL HIGH (ref 70–99)

## 2020-12-23 LAB — VITAMIN D 25 HYDROXY (VIT D DEFICIENCY, FRACTURES): Vit D, 25-Hydroxy: 46.58 ng/mL (ref 30–100)

## 2020-12-23 MED ORDER — INSULIN ASPART 100 UNIT/ML IJ SOLN
0.0000 [IU] | Freq: Every day | INTRAMUSCULAR | Status: DC
Start: 2020-12-23 — End: 2020-12-24

## 2020-12-23 MED ORDER — INSULIN ASPART 100 UNIT/ML IJ SOLN: 0.0000 [IU] | Freq: Three times a day (TID) | INTRAMUSCULAR | Status: DC

## 2020-12-23 NOTE — Progress Notes (Signed)
ANTICOAGULATION CONSULT NOTE Pharmacy Consult for heparin Indication: pulmonary embolus  No Known Allergies  Patient Measurements: Height: 5\' 2"  (157.5 cm) Weight: 54.5 kg (120 lb 1.6 oz) IBW/kg (Calculated) : 50.1 Heparin Dosing Weight: TBW  Vital Signs: Temp: 98.1 F (36.7 C) (06/18 2128) Temp Source: Oral (06/18 2128) BP: 129/49 (06/18 2128) Pulse Rate: 65 (06/18 2128)  Labs: Recent Labs    12/22/20 0957 12/22/20 1922 12/23/20 0113  HGB 10.8*  --  10.7*  HCT 32.6*  --  32.5*  PLT 214  --  251  HEPARINUNFRC  --  0.69 0.88*  CREATININE 1.46*  --  1.18*  TROPONINIHS 17  --   --      Estimated Creatinine Clearance: 26.1 mL/min (A) (by C-G formula based on SCr of 1.18 mg/dL (H)).  Assessment: 87 y.o. female with PE for heparin  Goal of Therapy:  Heparin level 0.3-0.7 units/ml Monitor platelets by anticoagulation protocol: Yes   Plan:  Decrease Heparin 750 units/hr Check heparin level in 8 hours.  98, PharmD, BCPS

## 2020-12-23 NOTE — Progress Notes (Signed)
ANTICOAGULATION CONSULT NOTE Pharmacy Consult for heparin Indication: pulmonary embolus  No Known Allergies  Patient Measurements: Height: 5\' 2"  (157.5 cm) Weight: 54.5 kg (120 lb 1.6 oz) IBW/kg (Calculated) : 50.1 Heparin Dosing Weight: TBW  Vital Signs: Temp: 98 F (36.7 C) (06/19 2113) Temp Source: Oral (06/19 2113) BP: 123/66 (06/19 2113) Pulse Rate: 64 (06/19 2113)  Labs: Recent Labs    12/22/20 0957 12/22/20 1922 12/23/20 0113 12/23/20 1203 12/23/20 2247  HGB 10.8*  --  10.7*  --   --   HCT 32.6*  --  32.5*  --   --   PLT 214  --  251  --   --   HEPARINUNFRC  --    < > 0.88* 0.33 0.25*  CREATININE 1.46*  --  1.18*  --   --   TROPONINIHS 17  --   --   --   --    < > = values in this interval not displayed.     Estimated Creatinine Clearance: 26.1 mL/min (A) (by C-G formula based on SCr of 1.18 mg/dL (H)).  Assessment: 87 y.o. female with PE for heparin  Goal of Therapy:  Heparin level 0.3-0.7 units/ml Monitor platelets by anticoagulation protocol: Yes   Plan:  Increase Heparin 900 units/hr  98, PharmD, BCPS

## 2020-12-23 NOTE — Progress Notes (Signed)
BLE venous duplex has been completed.  Preliminary findings given to [place], Charity fundraiser.   Results can be found under chart review under CV PROC. 12/23/2020 12:29 PM Delrick Dehart RVT, RDMS

## 2020-12-23 NOTE — Progress Notes (Signed)
ANTICOAGULATION CONSULT NOTE Pharmacy Consult for heparin Indication: pulmonary embolus  No Known Allergies  Patient Measurements: Height: 5\' 2"  (157.5 cm) Weight: 54.5 kg (120 lb 1.6 oz) IBW/kg (Calculated) : 50.1 Heparin Dosing Weight: TBW  Vital Signs: Temp: 98.2 F (36.8 C) (06/19 1052) Temp Source: Oral (06/19 1052) BP: 146/90 (06/19 1052) Pulse Rate: 70 (06/19 0428)  Labs: Recent Labs    12/22/20 0957 12/22/20 1922 12/23/20 0113 12/23/20 1203  HGB 10.8*  --  10.7*  --   HCT 32.6*  --  32.5*  --   PLT 214  --  251  --   HEPARINUNFRC  --  0.69 0.88* 0.33  CREATININE 1.46*  --  1.18*  --   TROPONINIHS 17  --   --   --      Estimated Creatinine Clearance: 26.1 mL/min (A) (by C-G formula based on SCr of 1.18 mg/dL (H)).  Assessment: 87 y.o. female with PE, pharmacy has been consulted for treatment dose heparin. Recheck heparin level is 0.33 which is right at goal. Most recent Hgb 10.7 and Plt wnl. No signs or symptoms of bleeding.   Goal of Therapy:  Heparin level 0.3-0.7 units/ml Monitor platelets by anticoagulation protocol: Yes   Plan:  Increase Heparin slightly to 800 units/hr Daily HL and CBC  Monitor s/sx of bleed  F/u plan for oral [facility]   [facility], PharmD, MBA Pharmacy Resident [phone] 12/23/2020 1:21 PM

## 2020-12-23 NOTE — Progress Notes (Signed)
PROGRESS NOTE    Amber Gonzales  FYB:[id number] DOB: [birth year] DOA: 12/22/2020 PCP: Pcp, No    Brief Narrative:   87 y.o. female with medical history significant of HTN, IIDM, CKD stage III, with diet control, recent COVID infection, presented with worsening of chest pains. Pt was found to have evidence of PE on CTA without evidence of RH strain. Pt was admitted for further work up  Assessment & Plan:   Active Problems:   Pulmonary emboli (HCC)  PE, submental and sup supplemental -Likely related to recent COVID infection -Currently on heparin gtt -2d echo is pending -LE dopplers reviewed, findings of acute R post tibial DVT -Cont O2 as needed and wean as tolerated -repeat CBC in AM   CKD stage III -given IV fluids to prevent IV contrast nephropathy -Lasix currently on hold   T12 compression fracture -Cont analgesia as needed -appears stable at this time -Will consult PT   HTN uncontrolled -On atenolol -BP stable -Lasix currently on hold per above   IIDM -Continue diet control. -Will order SSI coverage   DVT prophylaxis: Heparin gtt Code Status: Full Family Communication: Pt in room, family is at bedside  Status is: Inpatient  Remains inpatient appropriate because:Inpatient level of care appropriate due to severity of illness  Dispo: The patient is from: Home              Anticipated d/c is to: Home              Patient currently is not medically stable to d/c.   Difficult to place patient No       Consultants:    Procedures:    Antimicrobials: Anti-infectives (From admission, onward)    None       Subjective: Complains of intermittent chest discomfort  Objective: Vitals:   12/23/20 0545 12/23/20 1052 12/23/20 1425 12/23/20 1651  BP:  (!) 146/90 (!) 163/56 (!) 143/55  Pulse:    69  Resp:  18 14 20   Temp: 97.9 F (36.6 C) 98.2 F (36.8 C)  98.2 F (36.8 C)  TempSrc: Oral Oral  Oral  SpO2:    98%  Weight:      Height:       No  intake or output data in the 24 hours ending 12/23/20 1728 Filed Weights   12/22/20 1713  Weight: 54.5 kg    Examination: General exam: Awake, laying in bed, in nad Respiratory system: Normal respiratory effort, no wheezing Cardiovascular system: regular rate, s1, s2 Gastrointestinal system: Soft, nondistended, positive BS Central nervous system: CN2-12 grossly intact, strength intact Extremities: Perfused, no clubbing Skin: Normal skin turgor, no notable skin lesions seen Psychiatry: Mood normal // no visual hallucinations    Data Reviewed: I have personally reviewed following labs and imaging studies  CBC: Recent Labs  Lab 12/22/20 0957 12/23/20 0113  WBC 8.0 10.6*  HGB 10.8* 10.7*  HCT 32.6* 32.5*  MCV 99.7 101.2*  PLT 214 251   Basic Metabolic Panel: Recent Labs  Lab 12/22/20 0957 12/23/20 0113  NA 139 137  K 3.9 3.8  CL 102 104  CO2 29 23  GLUCOSE 124* 117*  BUN 24* 16  CREATININE 1.46* 1.18*  CALCIUM 9.0 8.4*   GFR: Estimated Creatinine Clearance: 26.1 mL/min (A) (by C-G formula based on SCr of 1.18 mg/dL (H)). Liver Function Tests: Recent Labs  Lab 12/22/20 0957  AST 20  ALT 13  ALKPHOS 124  BILITOT 1.0  PROT 7.1  ALBUMIN 3.5  Recent Labs  Lab 12/22/20 0957  LIPASE 36   No results for input(s): AMMONIA in the last 168 hours. Coagulation Profile: No results for input(s): INR, PROTIME in the last 168 hours. Cardiac Enzymes: No results for input(s): CKTOTAL, CKMB, CKMBINDEX, TROPONINI in the last 168 hours. BNP (last 3 results) No results for input(s): PROBNP in the last 8760 hours. HbA1C: No results for input(s): HGBA1C in the last 72 hours. CBG: Recent Labs  Lab 12/22/20 1004  GLUCAP 128*   Lipid Profile: No results for input(s): CHOL, HDL, LDLCALC, TRIG, CHOLHDL, LDLDIRECT in the last 72 hours. Thyroid Function Tests: No results for input(s): TSH, T4TOTAL, FREET4, T3FREE, THYROIDAB in the last 72 hours. Anemia Panel: No results  for input(s): VITAMINB12, FOLATE, FERRITIN, TIBC, IRON, RETICCTPCT in the last 72 hours. Sepsis Labs: No results for input(s): PROCALCITON, LATICACIDVEN in the last 168 hours.  No results found for this or any previous visit (from the past 240 hour(s)).   Radiology Studies: CT Angio Chest PE W and/or Wo Contrast  Result Date: 12/22/2020 CLINICAL DATA:  Patient with discomfort.  Flank pain.  Achiness. EXAM: CT ANGIOGRAPHY CHEST CT ABDOMEN AND PELVIS WITH CONTRAST TECHNIQUE: Multidetector CT imaging of the chest was performed using the standard protocol during bolus administration of intravenous contrast. Multiplanar CT image reconstructions and MIPs were obtained to evaluate the vascular anatomy. Multidetector CT imaging of the abdomen and pelvis was performed using the standard protocol during bolus administration of intravenous contrast. CONTRAST:  75mL OMNIPAQUE IOHEXOL 350 MG/ML SOLN COMPARISON:  None. FINDINGS: CTA CHEST FINDINGS Cardiovascular: Heart is mildly enlarged. Trace fluid superior pericardial recess. Thoracic aortic vascular calcifications. Coronary arterial vascular calcifications. Adequate opacification of the pulmonary arterial system. Intraluminal filling defects are demonstrated within the segmental and subsegmental pulmonary arteries of the left upper, left lower and right lower lobes. Additionally pulmonary embolus demonstrated within the right middle lobe subsegmental pulmonary arteries. No CT evidence to suggest right heart strain. Mediastinum/Nodes: No enlarged axillary, mediastinal or hilar lymphadenopathy. Normal appearance of the esophagus. Lungs/Pleura: Central airways are patent. Patchy ground-glass and consolidative opacities demonstrated within the lingula and bilateral lower lobes. No definite pleural effusion or pneumothorax. 3 mm subpleural right upper lobe nodule (image 68; series 6). Small left pleural effusion. Musculoskeletal: Thoracic spine degenerative changes.  Redemonstrated T12 compression fracture with associated sclerosis. Review of the MIP images confirms the above findings. CT ABDOMEN and PELVIS FINDINGS Hepatobiliary: The liver is normal in size and contour. No focal hepatic lesion is identified. Gallbladder is unremarkable. No intrahepatic or extrahepatic biliary ductal dilatation. Pancreas: Unremarkable Spleen: Unremarkable Adrenals/Urinary Tract: Normal adrenal glands. Kidneys enhance symmetrically with contrast. There is a 1.9 cm partially exophytic cyst off the superior pole of left kidney. Urinary bladder is unremarkable. Stomach/Bowel: No abnormal bowel wall thickening or evidence for bowel obstruction. No free fluid or free intraperitoneal air. Vascular/Lymphatic: Normal caliber abdominal aorta. Peripheral calcified atherosclerotic plaque. Reproductive: Uterus and adnexal structures are unremarkable. Other: None. Musculoskeletal: Lumbar spine degenerative changes. No aggressive or acute appearing osseous lesions. Review of the MIP images confirms the above findings. IMPRESSION: Findings compatible with acute segmental and subsegmental pulmonary embolus bilaterally. No CT evidence to suggest acute right heart strain. Age indeterminate likely chronic T12 compression deformity. No acute process within the abdomen or pelvis. 3 mm nodule right upper lobe. No follow-up needed if patient is low-risk. Non-contrast chest CT can be considered in 12 months if patient is high-risk. This recommendation follows the consensus statement: Guidelines for  Management of Incidental Pulmonary Nodules Detected on CT Images: From the Fleischner Society 2017; Radiology 2017; 284:228-243. Critical Value/emergent results were called by telephone at the time of interpretation on 12/22/2020 at 12:30 pm to provider [facility] , who verbally acknowledged these results. Electronically Signed   By: Annia Belt M.D.   On: 12/22/2020 12:34   CT ABDOMEN PELVIS W CONTRAST  Result Date:  12/22/2020 CLINICAL DATA:  Patient with discomfort.  Flank pain.  Achiness. EXAM: CT ANGIOGRAPHY CHEST CT ABDOMEN AND PELVIS WITH CONTRAST TECHNIQUE: Multidetector CT imaging of the chest was performed using the standard protocol during bolus administration of intravenous contrast. Multiplanar CT image reconstructions and MIPs were obtained to evaluate the vascular anatomy. Multidetector CT imaging of the abdomen and pelvis was performed using the standard protocol during bolus administration of intravenous contrast. CONTRAST:  72mL OMNIPAQUE IOHEXOL 350 MG/ML SOLN COMPARISON:  None. FINDINGS: CTA CHEST FINDINGS Cardiovascular: Heart is mildly enlarged. Trace fluid superior pericardial recess. Thoracic aortic vascular calcifications. Coronary arterial vascular calcifications. Adequate opacification of the pulmonary arterial system. Intraluminal filling defects are demonstrated within the segmental and subsegmental pulmonary arteries of the left upper, left lower and right lower lobes. Additionally pulmonary embolus demonstrated within the right middle lobe subsegmental pulmonary arteries. No CT evidence to suggest right heart strain. Mediastinum/Nodes: No enlarged axillary, mediastinal or hilar lymphadenopathy. Normal appearance of the esophagus. Lungs/Pleura: Central airways are patent. Patchy ground-glass and consolidative opacities demonstrated within the lingula and bilateral lower lobes. No definite pleural effusion or pneumothorax. 3 mm subpleural right upper lobe nodule (image 68; series 6). Small left pleural effusion. Musculoskeletal: Thoracic spine degenerative changes. Redemonstrated T12 compression fracture with associated sclerosis. Review of the MIP images confirms the above findings. CT ABDOMEN and PELVIS FINDINGS Hepatobiliary: The liver is normal in size and contour. No focal hepatic lesion is identified. Gallbladder is unremarkable. No intrahepatic or extrahepatic biliary ductal dilatation.  Pancreas: Unremarkable Spleen: Unremarkable Adrenals/Urinary Tract: Normal adrenal glands. Kidneys enhance symmetrically with contrast. There is a 1.9 cm partially exophytic cyst off the superior pole of left kidney. Urinary bladder is unremarkable. Stomach/Bowel: No abnormal bowel wall thickening or evidence for bowel obstruction. No free fluid or free intraperitoneal air. Vascular/Lymphatic: Normal caliber abdominal aorta. Peripheral calcified atherosclerotic plaque. Reproductive: Uterus and adnexal structures are unremarkable. Other: None. Musculoskeletal: Lumbar spine degenerative changes. No aggressive or acute appearing osseous lesions. Review of the MIP images confirms the above findings. IMPRESSION: Findings compatible with acute segmental and subsegmental pulmonary embolus bilaterally. No CT evidence to suggest acute right heart strain. Age indeterminate likely chronic T12 compression deformity. No acute process within the abdomen or pelvis. 3 mm nodule right upper lobe. No follow-up needed if patient is low-risk. Non-contrast chest CT can be considered in 12 months if patient is high-risk. This recommendation follows the consensus statement: Guidelines for Management of Incidental Pulmonary Nodules Detected on CT Images: From the Fleischner Society 2017; Radiology 2017; 284:228-243. Critical Value/emergent results were called by telephone at the time of interpretation on 12/22/2020 at 12:30 pm to provider [facility] , who verbally acknowledged these results. Electronically Signed   By: Annia Belt M.D.   On: 12/22/2020 12:34   DG Abdomen Acute W/Chest  Result Date: 12/22/2020 CLINICAL DATA:  Chest pain EXAM: DG ABDOMEN ACUTE WITH 1 VIEW CHEST COMPARISON:  None. FINDINGS: Heart size and vascularity normal.  Atherosclerotic aortic arch. Elevated left hemidiaphragm with mild left lower lobe atelectasis. Right lung is clear. Normal bowel gas pattern. No  bowel obstruction or free air. Dense calcification  overlying the right sacrum of uncertain etiology. Doubt kidney stone. No acute skeletal abnormality. IMPRESSION: Left lower lobe atelectasis Normal bowel gas pattern Electronically Signed   By: Marlan Palau M.D.   On: 12/22/2020 10:53   VAS [place] LOWER EXTREMITY VENOUS (DVT)  Result Date: 12/23/2020  Lower Venous DVT Study Patient Name:  Amber Gonzales  Date of Exam:   12/23/2020 Medical Rec #: [id number]     Accession #:    [id number] Date of Birth: [birth year]     Patient Gender: F Patient Age:   89Y Exam Location:  [facility] Procedure:      VAS [place] LOWER EXTREMITY VENOUS (DVT) Referring Phys: [id number] [place] General --------------------------------------------------------------------------------  Indications: Pulmonary embolism.  Risk Factors: Recent COVID19 infection. Comparison Study: No previous exams Performing Technologist: Jody Hill RVT, RDMS  Examination Guidelines: A complete evaluation includes B-mode imaging, spectral Doppler, color Doppler, and power Doppler as needed of all accessible portions of each vessel. Bilateral testing is considered an integral part of a complete examination. Limited examinations for reoccurring indications may be performed as noted. The reflux portion of the exam is performed with the patient in reverse Trendelenburg.  +---------+---------------+---------+-----------+----------+-------------------+ RIGHT    CompressibilityPhasicitySpontaneityPropertiesThrombus Aging      +---------+---------------+---------+-----------+----------+-------------------+ CFV      Full           Yes      Yes                                      +---------+---------------+---------+-----------+----------+-------------------+ SFJ      Full                                                             +---------+---------------+---------+-----------+----------+-------------------+ FV Prox  Full           Yes      Yes                                       +---------+---------------+---------+-----------+----------+-------------------+ FV Mid   Full           Yes      Yes                                      +---------+---------------+---------+-----------+----------+-------------------+ FV DistalFull           Yes      Yes                                      +---------+---------------+---------+-----------+----------+-------------------+ PFV      Full                                                             +---------+---------------+---------+-----------+----------+-------------------+  POP      Full           Yes      Yes                                      +---------+---------------+---------+-----------+----------+-------------------+ PTV      None           No       No                   Acute DVT - one of                                                        paired PTVs         +---------+---------------+---------+-----------+----------+-------------------+ PERO     Full                                                             +---------+---------------+---------+-----------+----------+-------------------+   +---------+---------------+---------+-----------+----------+--------------+ LEFT     CompressibilityPhasicitySpontaneityPropertiesThrombus Aging +---------+---------------+---------+-----------+----------+--------------+ CFV      Full           Yes      Yes                                 +---------+---------------+---------+-----------+----------+--------------+ SFJ      Full                                                        +---------+---------------+---------+-----------+----------+--------------+ FV Prox  Full           Yes      Yes                                 +---------+---------------+---------+-----------+----------+--------------+ FV Mid   Full           Yes      Yes                                  +---------+---------------+---------+-----------+----------+--------------+ FV DistalFull           Yes      Yes                                 +---------+---------------+---------+-----------+----------+--------------+ PFV      Full                                                        +---------+---------------+---------+-----------+----------+--------------+ POP  Full           Yes      Yes                                 +---------+---------------+---------+-----------+----------+--------------+ PTV      Full                                                        +---------+---------------+---------+-----------+----------+--------------+ PERO     Full                                                        +---------+---------------+---------+-----------+----------+--------------+    Summary: BILATERAL: - No evidence of superficial venous thrombosis in the lower extremities, bilaterally. -No evidence of popliteal cyst, bilaterally. RIGHT: - Findings consistent with acute deep vein thrombosis involving the right posterior tibial veins.  LEFT: - There is no evidence of deep vein thrombosis in the lower extremity.  *See table(s) above for measurements and observations.    Preliminary     Scheduled Meds:  atenolol  50 mg Oral Daily   hydrALAZINE  25 mg Oral Q8H   Continuous Infusions:  sodium chloride 125 mL/hr at 12/23/20 1507   heparin 800 Units/hr (12/23/20 1508)     LOS: 1 day   Rickey Barbara, MD [facility] Pager On [employer]  If 7PM-7AM, please contact night-coverage 12/23/2020, 5:28 PM

## 2020-12-24 ENCOUNTER — Inpatient Hospital Stay (HOSPITAL_COMMUNITY): Payer: Medicare Other

## 2020-12-24 ENCOUNTER — Other Ambulatory Visit (HOSPITAL_COMMUNITY): Payer: Self-pay

## 2020-12-24 DIAGNOSIS — I2693 Single subsegmental pulmonary embolism without acute cor pulmonale: Secondary | ICD-10-CM | POA: Diagnosis not present

## 2020-12-24 DIAGNOSIS — I2694 Multiple subsegmental pulmonary emboli without acute cor pulmonale: Secondary | ICD-10-CM | POA: Diagnosis not present

## 2020-12-24 LAB — ECHOCARDIOGRAM COMPLETE
AR max vel: 1.98 cm2
AV Area VTI: 1.87 cm2
AV Area mean vel: 1.79 cm2
AV Mean grad: 4 mmHg
AV Peak grad: 6.8 mmHg
Ao pk vel: 1.3 m/s
Area-P 1/2: 3.3 cm2
Calc EF: 58.3 %
Height: 62 in
S' Lateral: 3.7 cm
Single Plane A2C EF: 60.1 %
Single Plane A4C EF: 53.3 %
Weight: 1921.6 oz

## 2020-12-24 LAB — CBC
HCT: 32 % — ABNORMAL LOW (ref 36.0–46.0)
Hemoglobin: 10.8 g/dL — ABNORMAL LOW (ref 12.0–15.0)
MCH: 33.8 pg (ref 26.0–34.0)
MCHC: 33.8 g/dL (ref 30.0–36.0)
MCV: 100 fL (ref 80.0–100.0)
Platelets: 275 10*3/uL (ref 150–400)
RBC: 3.2 MIL/uL — ABNORMAL LOW (ref 3.87–5.11)
RDW: 11.9 % (ref 11.5–15.5)
WBC: 9.3 10*3/uL (ref 4.0–10.5)
nRBC: 0 % (ref 0.0–0.2)

## 2020-12-24 LAB — GLUCOSE, CAPILLARY
Glucose-Capillary: 89 mg/dL (ref 70–99)
Glucose-Capillary: 84 mg/dL (ref 70–99)

## 2020-12-24 LAB — HEPARIN LEVEL (UNFRACTIONATED): Heparin Unfractionated: 0.33 IU/mL (ref 0.30–0.70)

## 2020-12-24 MED ORDER — APIXABAN 5 MG PO TABS: 5.0000 mg | ORAL_TABLET | Freq: Two times a day (BID) | ORAL | Status: DC

## 2020-12-24 MED ORDER — ENSURE ENLIVE PO LIQD: 237.0000 mL | Freq: Two times a day (BID) | ORAL | Status: DC

## 2020-12-24 MED ORDER — APIXABAN (ELIQUIS) VTE STARTER PACK (10MG AND 5MG)
ORAL_TABLET | ORAL | 0 refills | Status: DC
  Filled 2020-12-24: qty 74, 30d supply, fill #0

## 2020-12-24 MED ORDER — APIXABAN 5 MG PO TABS
10.0000 mg | ORAL_TABLET | Freq: Two times a day (BID) | ORAL | Status: DC
  Administered 2020-12-24: 10 mg via ORAL
  Filled 2020-12-24: qty 2

## 2020-12-24 MED ORDER — ADULT MULTIVITAMIN W/MINERALS CH
1.0000 | ORAL_TABLET | Freq: Every day | ORAL | Status: DC
  Administered 2020-12-24: 1 via ORAL
  Filled 2020-12-24: qty 1

## 2020-12-24 NOTE — Progress Notes (Signed)
ANTICOAGULATION CONSULT NOTE - Initial Consult  Pharmacy Consult for Eliquis Indication: pulmonary embolus  No Known Allergies  Patient Measurements: Height: 5\' 2"  (157.5 cm) Weight: 54.5 kg (120 lb 1.6 oz) IBW/kg (Calculated) : 50.1  Vital Signs: Temp: 98 F (36.7 C) (06/20 0748) Temp Source: Oral (06/20 0748) BP: 154/53 (06/20 0748) Pulse Rate: 66 (06/20 0443)  Labs: Recent Labs    12/22/20 0957 12/22/20 1922 12/22/20 1922 12/23/20 0113 12/23/20 1203 12/23/20 2247 12/24/20 1023  HGB 10.8*  --   --  10.7*  --   --  10.8*  HCT 32.6*  --   --  32.5*  --   --  32.0*  PLT 214  --   --  251  --   --  275  HEPARINUNFRC  --  0.69   < > 0.88* 0.33 0.25* 0.33  CREATININE 1.46*  --   --  1.18*  --   --   --   TROPONINIHS 17 33*  --   --   --   --   --    < > = values in this interval not displayed.    Estimated Creatinine Clearance: 26.1 mL/min (A) (by C-G formula based on SCr of 1.18 mg/dL (H)).   Medical History: Past Medical History:  Diagnosis Date   Diabetes mellitus without complication (HCC)    Hypertension    Renal disorder      Assessment:  Anticoag: CT with subseg PE bilaterally, no RHS. Dopplers +acute R post tibial DVT.  Recent COVID dx. IV heparin (HL 0.33)>>Eliquis on 6/20. Hgb 10.8 stable. Plts WNL. Scr 1.18   Goal of Therapy:  Therapeutic oral anticoagulation   Plan:  D/c IV heparin Eliquis 10mg  BID x 7d then 5mg  BID   Tresean Mattix S. 7/20, PharmD, BCPS Clinical Staff Pharmacist [email or web address] , 12/24/2020,11:19 AM

## 2020-12-24 NOTE — Progress Notes (Signed)
Pt woke up confused. Pt disoriented about environment. We reassured her for safety and reorient her with no success. Stayed with pt to keep her safe. Charge nurse Selena Batten came to bedside. Paged Dr. Antionette Char MD via Loretha Stapler. No new orders. Pt called 911 operator and I spoke to them and they understood pt mentation at during the phone call. Spoke to son after pt called him and he responded calmly.

## 2020-12-24 NOTE — Discharge Summary (Signed)
Physician Discharge Summary  Amber Gonzales WUJ:[id number] DOB: 10/31/1929 DOA: 12/22/2020  PCP: Oneita HurtPcp, No  Admit date: 12/22/2020 Discharge date: 12/24/2020  Admitted From: Home Disposition:  Home  Recommendations for Outpatient Follow-up:  Follow up with PCP in 1-2 weeks  Home Health:PT  Equipment/Devices:Recommendation for outpatient orthotics for R ankle brace    Discharge Condition:Stable CODE STATUS:Full Diet recommendation: Heart healthy   Brief/Interim Summary: 87 y.o. female with medical history significant of HTN, IIDM, CKD stage III, with diet control, recent COVID infection, presented with worsening of chest pains. Pt was found to have evidence of PE on CTA without evidence of RH strain. Pt was admitted for further work up  Discharge Diagnoses:  Active Problems:   Pulmonary emboli (HCC)  PE, submental and sup supplemental -Likely related to recent COVID infection -Initially on heparin gtt, transitioned to eliquis on d/c -2d echo reviewed, unremarkabe -LE dopplers reviewed, findings of acute R post tibial DVT -weaned to minimal O2 support   CKD stage III -given IV fluids to prevent IV contrast nephropathy -Lasix initially on hold -remained stable   T12 compression fracture -Cont analgesia as needed -appears stable at this time -HHPT recommended by PT -Pt with R foot drop noted, recommendation for R ankle brace. Have referred to outpt orthotic   HTN uncontrolled -Continued on atenolol -BP stable   IIDM -Continue diet control. -On SSI coverage while in hospital    Discharge Instructions  Discharge Instructions     Ambulatory referral for Orthotics   Complete by: As directed    Needs brace for right foot drop      Allergies as of 12/24/2020   No Known Allergies      Medication List     STOP taking these medications    methocarbamol 500 MG tablet Commonly known as: ROBAXIN   Molnupiravir 200 MG Caps       TAKE these medications     acetaminophen 500 MG tablet Commonly known as: TYLENOL Take 1,000 mg by mouth every 6 (six) hours as needed for mild pain, fever or headache.   Apixaban Starter Pack (10mg  and 5mg ) Commonly known as: ELIQUIS STARTER PACK Take as directed on package: start with two-5mg  tablets twice daily for 7 days. On day 8, switch to one-5mg  tablet twice daily.   atenolol 50 MG tablet Commonly known as: TENORMIN Take 50 mg by mouth daily.   hydrALAZINE 25 MG tablet Commonly known as: APRESOLINE Take 25 mg by mouth 2 (two) times daily.   VITAMIN D3 PO Take 1 capsule by mouth daily.        Follow-up Information     Follow up with PCP in 1-2 weeks Follow up.   Why: Hospital follow up               No Known Allergies  Procedures/Studies: CT HEAD WO CONTRAST  Result Date: 12/10/2020 CLINICAL DATA:  Weakness. EXAM: CT HEAD WITHOUT CONTRAST TECHNIQUE: Contiguous axial images were obtained from the base of the skull through the vertex without intravenous contrast. COMPARISON:  None. FINDINGS: Brain: Mild chronic ischemic white matter disease is noted. No mass effect or midline shift is noted. Ventricular size is within normal limits. There is no evidence of mass lesion, hemorrhage or acute infarction. Vascular: No hyperdense vessel or unexpected calcification. Skull: Normal. Negative for fracture or focal lesion. Sinuses/Orbits: No acute finding. Other: None. IMPRESSION: No acute intracranial abnormality seen. Electronically Signed   By: Lupita RaiderJames  Green Jr M.D.   On:  12/10/2020 16:52   CT Angio Chest PE W and/or Wo Contrast  Result Date: 12/22/2020 CLINICAL DATA:  Patient with discomfort.  Flank pain.  Achiness. EXAM: CT ANGIOGRAPHY CHEST CT ABDOMEN AND PELVIS WITH CONTRAST TECHNIQUE: Multidetector CT imaging of the chest was performed using the standard protocol during bolus administration of intravenous contrast. Multiplanar CT image reconstructions and MIPs were obtained to evaluate the  vascular anatomy. Multidetector CT imaging of the abdomen and pelvis was performed using the standard protocol during bolus administration of intravenous contrast. CONTRAST:  75mL OMNIPAQUE IOHEXOL 350 MG/ML SOLN COMPARISON:  None. FINDINGS: CTA CHEST FINDINGS Cardiovascular: Heart is mildly enlarged. Trace fluid superior pericardial recess. Thoracic aortic vascular calcifications. Coronary arterial vascular calcifications. Adequate opacification of the pulmonary arterial system. Intraluminal filling defects are demonstrated within the segmental and subsegmental pulmonary arteries of the left upper, left lower and right lower lobes. Additionally pulmonary embolus demonstrated within the right middle lobe subsegmental pulmonary arteries. No CT evidence to suggest right heart strain. Mediastinum/Nodes: No enlarged axillary, mediastinal or hilar lymphadenopathy. Normal appearance of the esophagus. Lungs/Pleura: Central airways are patent. Patchy ground-glass and consolidative opacities demonstrated within the lingula and bilateral lower lobes. No definite pleural effusion or pneumothorax. 3 mm subpleural right upper lobe nodule (image 68; series 6). Small left pleural effusion. Musculoskeletal: Thoracic spine degenerative changes. Redemonstrated T12 compression fracture with associated sclerosis. Review of the MIP images confirms the above findings. CT ABDOMEN and PELVIS FINDINGS Hepatobiliary: The liver is normal in size and contour. No focal hepatic lesion is identified. Gallbladder is unremarkable. No intrahepatic or extrahepatic biliary ductal dilatation. Pancreas: Unremarkable Spleen: Unremarkable Adrenals/Urinary Tract: Normal adrenal glands. Kidneys enhance symmetrically with contrast. There is a 1.9 cm partially exophytic cyst off the superior pole of left kidney. Urinary bladder is unremarkable. Stomach/Bowel: No abnormal bowel wall thickening or evidence for bowel obstruction. No free fluid or free  intraperitoneal air. Vascular/Lymphatic: Normal caliber abdominal aorta. Peripheral calcified atherosclerotic plaque. Reproductive: Uterus and adnexal structures are unremarkable. Other: None. Musculoskeletal: Lumbar spine degenerative changes. No aggressive or acute appearing osseous lesions. Review of the MIP images confirms the above findings. IMPRESSION: Findings compatible with acute segmental and subsegmental pulmonary embolus bilaterally. No CT evidence to suggest acute right heart strain. Age indeterminate likely chronic T12 compression deformity. No acute process within the abdomen or pelvis. 3 mm nodule right upper lobe. No follow-up needed if patient is low-risk. Non-contrast chest CT can be considered in 12 months if patient is high-risk. This recommendation follows the consensus statement: Guidelines for Management of Incidental Pulmonary Nodules Detected on CT Images: From the Fleischner Society 2017; Radiology 2017; 284:228-243. Critical Value/emergent results were called by telephone at the time of interpretation on 12/22/2020 at 12:30 pm to provider [facility] , who verbally acknowledged these results. Electronically Signed   By: Annia Belt M.D.   On: 12/22/2020 12:34   MR BRAIN WO CONTRAST  Result Date: 12/10/2020 CLINICAL DATA:  Initial evaluation for neuro deficit, gait instability. EXAM: MRI HEAD WITHOUT CONTRAST TECHNIQUE: Multiplanar, multiecho pulse sequences of the brain and surrounding structures were obtained without intravenous contrast. COMPARISON:  Prior CT from earlier the same day. FINDINGS: Brain: Generalized age-related cerebral atrophy. Patchy and confluent T2/FLAIR hyperintensity involving the periventricular and deep white matter both cerebral hemispheres as well as the thalami and pons, most consistent with chronic microvascular ischemic disease, moderate in nature. No abnormal foci of restricted diffusion to suggest acute or subacute ischemia. Gray-white matter  differentiation maintained. No encephalomalacia  to suggest chronic cortical infarction. No acute intracranial hemorrhage. Single punctate chronic microhemorrhage noted within the left occipital lobe, of doubtful significance in isolation. No mass lesion, midline shift or mass effect. No hydrocephalus or extra-axial fluid collection. Pituitary gland suprasellar region normal. Midline structures intact. Vascular: Major intracranial vascular flow voids are maintained. Skull and upper cervical spine: Degenerative thickening noted about the tectorial membrane without significant stenosis. Craniocervical junction otherwise unremarkable. Degenerative disc bulging noted at C3-4 with resultant mild spinal stenosis. Bone marrow signal intensity within normal limits. No scalp soft tissue abnormality. Sinuses/Orbits: Patient status post ocular lens replacement on the right. Globes and orbital soft tissues demonstrate no acute finding. Scattered mucosal thickening noted within the ethmoidal air cells and maxillary sinuses, greater on the right. No mastoid effusion. Inner ear structures grossly normal. Other: None. IMPRESSION: 1. No acute intracranial abnormality. 2. Generalized age-related cerebral atrophy with moderate chronic microvascular ischemic disease. Electronically Signed   By: Rise Mu M.D.   On: 12/10/2020 23:29   CT ABDOMEN PELVIS W CONTRAST  Result Date: 12/22/2020 CLINICAL DATA:  Patient with discomfort.  Flank pain.  Achiness. EXAM: CT ANGIOGRAPHY CHEST CT ABDOMEN AND PELVIS WITH CONTRAST TECHNIQUE: Multidetector CT imaging of the chest was performed using the standard protocol during bolus administration of intravenous contrast. Multiplanar CT image reconstructions and MIPs were obtained to evaluate the vascular anatomy. Multidetector CT imaging of the abdomen and pelvis was performed using the standard protocol during bolus administration of intravenous contrast. CONTRAST:  7mL OMNIPAQUE IOHEXOL  350 MG/ML SOLN COMPARISON:  None. FINDINGS: CTA CHEST FINDINGS Cardiovascular: Heart is mildly enlarged. Trace fluid superior pericardial recess. Thoracic aortic vascular calcifications. Coronary arterial vascular calcifications. Adequate opacification of the pulmonary arterial system. Intraluminal filling defects are demonstrated within the segmental and subsegmental pulmonary arteries of the left upper, left lower and right lower lobes. Additionally pulmonary embolus demonstrated within the right middle lobe subsegmental pulmonary arteries. No CT evidence to suggest right heart strain. Mediastinum/Nodes: No enlarged axillary, mediastinal or hilar lymphadenopathy. Normal appearance of the esophagus. Lungs/Pleura: Central airways are patent. Patchy ground-glass and consolidative opacities demonstrated within the lingula and bilateral lower lobes. No definite pleural effusion or pneumothorax. 3 mm subpleural right upper lobe nodule (image 68; series 6). Small left pleural effusion. Musculoskeletal: Thoracic spine degenerative changes. Redemonstrated T12 compression fracture with associated sclerosis. Review of the MIP images confirms the above findings. CT ABDOMEN and PELVIS FINDINGS Hepatobiliary: The liver is normal in size and contour. No focal hepatic lesion is identified. Gallbladder is unremarkable. No intrahepatic or extrahepatic biliary ductal dilatation. Pancreas: Unremarkable Spleen: Unremarkable Adrenals/Urinary Tract: Normal adrenal glands. Kidneys enhance symmetrically with contrast. There is a 1.9 cm partially exophytic cyst off the superior pole of left kidney. Urinary bladder is unremarkable. Stomach/Bowel: No abnormal bowel wall thickening or evidence for bowel obstruction. No free fluid or free intraperitoneal air. Vascular/Lymphatic: Normal caliber abdominal aorta. Peripheral calcified atherosclerotic plaque. Reproductive: Uterus and adnexal structures are unremarkable. Other: None.  Musculoskeletal: Lumbar spine degenerative changes. No aggressive or acute appearing osseous lesions. Review of the MIP images confirms the above findings. IMPRESSION: Findings compatible with acute segmental and subsegmental pulmonary embolus bilaterally. No CT evidence to suggest acute right heart strain. Age indeterminate likely chronic T12 compression deformity. No acute process within the abdomen or pelvis. 3 mm nodule right upper lobe. No follow-up needed if patient is low-risk. Non-contrast chest CT can be considered in 12 months if patient is high-risk. This recommendation follows the consensus statement:  Guidelines for Management of Incidental Pulmonary Nodules Detected on CT Images: From the Fleischner Society 2017; Radiology 2017; 284:228-243. Critical Value/emergent results were called by telephone at the time of interpretation on 12/22/2020 at 12:30 pm to provider [facility] , who verbally acknowledged these results. Electronically Signed   By: Annia Belt M.D.   On: 12/22/2020 12:34   DG Abdomen Acute W/Chest  Result Date: 12/22/2020 CLINICAL DATA:  Chest pain EXAM: DG ABDOMEN ACUTE WITH 1 VIEW CHEST COMPARISON:  None. FINDINGS: Heart size and vascularity normal.  Atherosclerotic aortic arch. Elevated left hemidiaphragm with mild left lower lobe atelectasis. Right lung is clear. Normal bowel gas pattern. No bowel obstruction or free air. Dense calcification overlying the right sacrum of uncertain etiology. Doubt kidney stone. No acute skeletal abnormality. IMPRESSION: Left lower lobe atelectasis Normal bowel gas pattern Electronically Signed   By: Marlan Palau M.D.   On: 12/22/2020 10:53   ECHOCARDIOGRAM COMPLETE  Result Date: 12/24/2020    ECHOCARDIOGRAM REPORT   Patient Name:   Amber Gonzales Date of Exam: 12/24/2020 Medical Rec #:  [id number]    Height:       62.0 in Accession #:    [id number]   Weight:       120.1 lb Date of Birth:  1930/06/10    BSA:          1.539 m Patient Age:    87 years      BP:           171/68 mmHg Patient Gender: F            HR:           73 bpm. Exam Location:  Inpatient Procedure: Cardiac Doppler, Color Doppler and 2D Echo Indications:    Pulmonary embolus  History:        Patient has no prior history of Echocardiogram examinations.                 Risk Factors:Hypertension and Diabetes.  Sonographer:    Neomia Dear RDCS Referring Phys: [id number] Emeline General IMPRESSIONS  1. Left ventricular ejection fraction, by estimation, is 55 to 60%. The left ventricle has normal function. The left ventricle has no regional wall motion abnormalities. Left ventricular diastolic parameters are consistent with Grade I diastolic dysfunction (impaired relaxation). Elevated left ventricular end-diastolic pressure. The E/e' is 17.  2. Right ventricular systolic function is normal. The right ventricular size is normal. There is mildly elevated pulmonary artery systolic pressure. The estimated right ventricular systolic pressure is 40.7 mmHg.  3. Left atrial size was mildly dilated.  4. The mitral valve is abnormal. Mild mitral valve regurgitation.  5. The aortic valve is tricuspid. Aortic valve regurgitation is not visualized. Aortic valve mean gradient measures 4.0 mmHg.  6. The inferior vena cava is normal in size with greater than 50% respiratory variability, suggesting right atrial pressure of 3 mmHg. Comparison(s): No prior Echocardiogram. FINDINGS  Left Ventricle: Left ventricular ejection fraction, by estimation, is 55 to 60%. The left ventricle has normal function. The left ventricle has no regional wall motion abnormalities. The left ventricular internal cavity size was normal in size. There is  no left ventricular hypertrophy. Left ventricular diastolic parameters are consistent with Grade I diastolic dysfunction (impaired relaxation). Elevated left ventricular end-diastolic pressure. The E/e' is 84. Right Ventricle: The right ventricular size is normal. No increase in right ventricular  wall thickness. Right ventricular systolic function is normal. There is mildly elevated  pulmonary artery systolic pressure. The tricuspid regurgitant velocity is 3.07  m/s, and with an assumed right atrial pressure of 3 mmHg, the estimated right ventricular systolic pressure is 40.7 mmHg. Left Atrium: Left atrial size was mildly dilated. Right Atrium: Right atrial size was normal in size. Pericardium: There is no evidence of pericardial effusion. Mitral Valve: The mitral valve is abnormal. There is mild thickening of the mitral valve leaflet(s). Mild mitral valve regurgitation. Tricuspid Valve: The tricuspid valve is grossly normal. Tricuspid valve regurgitation is mild. Aortic Valve: The aortic valve is tricuspid. Aortic valve regurgitation is not visualized. Aortic valve mean gradient measures 4.0 mmHg. Aortic valve peak gradient measures 6.8 mmHg. Aortic valve area, by VTI measures 1.87 cm. Pulmonic Valve: The pulmonic valve was grossly normal. Pulmonic valve regurgitation is not visualized. Aorta: The aortic root and ascending aorta are structurally normal, with no evidence of dilitation. Venous: The inferior vena cava is normal in size with greater than 50% respiratory variability, suggesting right atrial pressure of 3 mmHg. IAS/Shunts: No atrial level shunt detected by color flow Doppler.  LEFT VENTRICLE PLAX 2D LVIDd:         5.40 cm      Diastology LVIDs:         3.70 cm      LV e' medial:    4.13 cm/s LV PW:         0.90 cm      LV E/e' medial:  15.4 LV IVS:        0.80 cm      LV e' lateral:   3.37 cm/s LVOT diam:     1.90 cm      LV E/e' lateral: 18.8 LV SV:         55 LV SV Index:   36 LVOT Area:     2.84 cm  LV Volumes (MOD) LV vol d, MOD A2C: 115.0 ml LV vol d, MOD A4C: 101.0 ml LV vol s, MOD A2C: 45.9 ml LV vol s, MOD A4C: 47.2 ml LV SV MOD A2C:     69.1 ml LV SV MOD A4C:     101.0 ml LV SV MOD BP:      65.3 ml RIGHT VENTRICLE RV Basal diam:  2.60 cm RV Mid diam:    1.90 cm RV S prime:     12.10  cm/s TAPSE (M-mode): 2.4 cm LEFT ATRIUM           Index       RIGHT ATRIUM           Index LA Vol (A2C): 49.6 ml 32.23 ml/m RA Area:     12.90 cm                                   RA Volume:   30.50 ml  19.82 ml/m  AORTIC VALVE                   PULMONIC VALVE AV Area (Vmax):    1.98 cm    PV Vmax:       0.80 m/s AV Area (Vmean):   1.79 cm    PV Vmean:      54.300 cm/s AV Area (VTI):     1.87 cm    PV VTI:        0.189 m AV Vmax:           130.00  cm/s PV Peak grad:  2.6 mmHg AV Vmean:          90.100 cm/s PV Mean grad:  1.0 mmHg AV VTI:            0.292 m AV Peak Grad:      6.8 mmHg AV Mean Grad:      4.0 mmHg LVOT Vmax:         90.70 cm/s LVOT Vmean:        56.800 cm/s LVOT VTI:          0.193 m LVOT/AV VTI ratio: 0.66  AORTA Ao Root diam: 3.10 cm Ao Asc diam:  2.75 cm MITRAL VALVE                TRICUSPID VALVE MV Area (PHT): 3.30 cm     TR Peak grad:   37.7 mmHg MV Decel Time: 230 msec     TR Vmax:        307.00 cm/s MV E velocity: 63.50 cm/s MV A velocity: 121.00 cm/s  SHUNTS MV E/A ratio:  0.52         Systemic VTI:  0.19 m                             Systemic Diam: 1.90 cm Zoila Shutter MD Electronically signed by Zoila Shutter MD Signature Date/Time: 12/24/2020/3:12:26 PM    Final    VAS [place] LOWER EXTREMITY VENOUS (DVT)  Result Date: 12/23/2020  Lower Venous DVT Study Patient Name:  Amber Gonzales  Date of Exam:   12/23/2020 Medical Rec #: [id number]     Accession #:    [id number] Date of Birth: 1930/06/02     Patient Gender: F Patient Age:   89Y Exam Location:  [facility] Procedure:      VAS [place] LOWER EXTREMITY VENOUS (DVT) Referring Phys: [id number] Emeline General --------------------------------------------------------------------------------  Indications: Pulmonary embolism.  Risk Factors: Recent COVID19 infection. Comparison Study: No previous exams Performing Technologist: Jody Hill RVT, RDMS  Examination Guidelines: A complete evaluation includes B-mode imaging, spectral Doppler, color Doppler,  and power Doppler as needed of all accessible portions of each vessel. Bilateral testing is considered an integral part of a complete examination. Limited examinations for reoccurring indications may be performed as noted. The reflux portion of the exam is performed with the patient in reverse Trendelenburg.  +---------+---------------+---------+-----------+----------+-------------------+ RIGHT    CompressibilityPhasicitySpontaneityPropertiesThrombus Aging      +---------+---------------+---------+-----------+----------+-------------------+ CFV      Full           Yes      Yes                                      +---------+---------------+---------+-----------+----------+-------------------+ SFJ      Full                                                             +---------+---------------+---------+-----------+----------+-------------------+ FV Prox  Full           Yes      Yes                                      +---------+---------------+---------+-----------+----------+-------------------+  FV Mid   Full           Yes      Yes                                      +---------+---------------+---------+-----------+----------+-------------------+ FV DistalFull           Yes      Yes                                      +---------+---------------+---------+-----------+----------+-------------------+ PFV      Full                                                             +---------+---------------+---------+-----------+----------+-------------------+ POP      Full           Yes      Yes                                      +---------+---------------+---------+-----------+----------+-------------------+ PTV      None           No       No                   Acute DVT - one of                                                        paired PTVs         +---------+---------------+---------+-----------+----------+-------------------+ PERO     Full                                                              +---------+---------------+---------+-----------+----------+-------------------+   +---------+---------------+---------+-----------+----------+--------------+ LEFT     CompressibilityPhasicitySpontaneityPropertiesThrombus Aging +---------+---------------+---------+-----------+----------+--------------+ CFV      Full           Yes      Yes                                 +---------+---------------+---------+-----------+----------+--------------+ SFJ      Full                                                        +---------+---------------+---------+-----------+----------+--------------+ FV Prox  Full           Yes      Yes                                 +---------+---------------+---------+-----------+----------+--------------+  FV Mid   Full           Yes      Yes                                 +---------+---------------+---------+-----------+----------+--------------+ FV DistalFull           Yes      Yes                                 +---------+---------------+---------+-----------+----------+--------------+ PFV      Full                                                        +---------+---------------+---------+-----------+----------+--------------+ POP      Full           Yes      Yes                                 +---------+---------------+---------+-----------+----------+--------------+ PTV      Full                                                        +---------+---------------+---------+-----------+----------+--------------+ PERO     Full                                                        +---------+---------------+---------+-----------+----------+--------------+     Summary: BILATERAL: - No evidence of superficial venous thrombosis in the lower extremities, bilaterally. -No evidence of popliteal cyst, bilaterally. RIGHT: - Findings consistent with acute deep vein  thrombosis involving the right posterior tibial veins.  LEFT: - There is no evidence of deep vein thrombosis in the lower extremity.  *See table(s) above for measurements and observations. Electronically signed by Coral Else MD on 12/23/2020 at 9:31:03 PM.    Final     Subjective: Eager to go home  Discharge Exam: Vitals:   12/24/20 1508 12/24/20 1511  BP:  (!) 128/57  Pulse: 67 63  Resp: 20 20  Temp: 98.3 F (36.8 C) 98.3 F (36.8 C)  SpO2: 99% 96%   Vitals:   12/24/20 0443 12/24/20 0748 12/24/20 1508 12/24/20 1511  BP: (!) 171/68 (!) 154/53  (!) 128/57  Pulse: 66  67 63  Resp: Temp: 98.1 F (36.7 C) 98 F (36.7 C) 98.3 F (36.8 C) 98.3 F (36.8 C)  TempSrc: Oral Oral    SpO2: 95%  99% 96%  Weight:      Height:        General: Pt is alert, awake, not in acute distress Cardiovascular: RRR, S1/S2  Respiratory: CTA bilaterally, no wheezing, no rhonchi Abdominal: Soft, NT, ND, bowel sounds + Extremities: no edema, no cyanosis   The results of significant diagnostics from this hospitalization (including imaging, microbiology,  ancillary and laboratory) are listed below for reference.     Microbiology: No results found for this or any previous visit (from the past 240 hour(s)).   Labs: BNP (last 3 results) No results for input(s): BNP in the last 8760 hours. Basic Metabolic Panel: Recent Labs  Lab 12/22/20 0957 12/23/20 0113  NA 139 137  K 3.9 3.8  CL 102 104  CO2 29 23  GLUCOSE 124* 117*  BUN 24* 16  CREATININE 1.46* 1.18*  CALCIUM 9.0 8.4*   Liver Function Tests: Recent Labs  Lab 12/22/20 0957  AST 20  ALT 13  ALKPHOS 124  BILITOT 1.0  PROT 7.1  ALBUMIN 3.5   Recent Labs  Lab 12/22/20 0957  LIPASE 36   No results for input(s): AMMONIA in the last 168 hours. CBC: Recent Labs  Lab 12/22/20 0957 12/23/20 0113 12/24/20 1023  WBC 8.0 10.6* 9.3  HGB 10.8* 10.7* 10.8*  HCT 32.6* 32.5* 32.0*  MCV 99.7 101.2* 100.0  PLT 214  251 275   Cardiac Enzymes: No results for input(s): CKTOTAL, CKMB, CKMBINDEX, TROPONINI in the last 168 hours. BNP: Invalid input(s): POCBNP CBG: Recent Labs  Lab 12/22/20 1004 12/23/20 2109 12/24/20 0725 12/24/20 1204  GLUCAP 128* 143* 89 84   D-Dimer Recent Labs    12/22/20 0957  DDIMER 4.88*   Hgb A1c No results for input(s): HGBA1C in the last 72 hours. Lipid Profile No results for input(s): CHOL, HDL, LDLCALC, TRIG, CHOLHDL, LDLDIRECT in the last 72 hours. Thyroid function studies No results for input(s): TSH, T4TOTAL, T3FREE, THYROIDAB in the last 72 hours.  Invalid input(s): FREET3 Anemia work up No results for input(s): VITAMINB12, FOLATE, FERRITIN, TIBC, IRON, RETICCTPCT in the last 72 hours. Urinalysis    Component Value Date/Time   COLORURINE COLORLESS (A) 12/22/2020 1206   APPEARANCEUR CLEAR 12/22/2020 1206   LABSPEC 1.024 12/22/2020 1206   PHURINE 6.5 12/22/2020 1206   GLUCOSEU NEGATIVE 12/22/2020 1206   HGBUR NEGATIVE 12/22/2020 1206   HGBUR negative 05/09/2008 0837   BILIRUBINUR NEGATIVE 12/22/2020 1206   KETONESUR NEGATIVE 12/22/2020 1206   PROTEINUR NEGATIVE 12/22/2020 1206   UROBILINOGEN 0.2 05/09/2008 0837   NITRITE NEGATIVE 12/22/2020 1206   LEUKOCYTESUR NEGATIVE 12/22/2020 1206   Sepsis Labs Invalid input(s): PROCALCITONIN,  WBC,  LACTICIDVEN Microbiology No results found for this or any previous visit (from the past 240 hour(s)).  Time spent: 30 min  SIGNED:   Rickey Barbara, MD  [facility] 12/24/2020, 4:10 PM  If 7PM-7AM, please contact night-coverage

## 2020-12-24 NOTE — TOC Benefit Eligibility Note (Signed)
Patient Advocate Encounter  Insurance verification completed.    The patient is currently admitted and upon discharge could be taking Eliquis 5 mg.  The current 30 day co-pay is, $47.00.   The patient is insured through [employer] Medicare Part D    Delphin Funes, CPhT Pharmacy Patient Advocate Specialist [facility] Direct Number: [phone]  Fax: [phone]        

## 2020-12-24 NOTE — Progress Notes (Signed)
Orthopedic Tech Progress Note Patient Details:  Amber Gonzales 1929-10-21 [id number] Spoke with Larita Fife in OT about an order for an ankle brace. OT wanted a referral to an orthotist as an outpatient so they can assess her for an AFO that reduces foot drop when walking. She doesn't need any of the braces we have in-house.  Patient ID: Amber Gonzales, female   DOB: 1930-06-13, 87 y.o.   MRN: [id number]  Gerald Stabs 12/24/2020, 3:41 PM

## 2020-12-24 NOTE — Evaluation (Signed)
Physical Therapy Evaluation Patient Details Name: Amber Gonzales MRN: [id number] DOB: 1929-12-21 Today's Date: 12/24/2020   History of Present Illness  87 y.o. female presented 12/22/20 with worsening of chest pains. CT angiogram showed bilateral subsegmental and segmental PE; doppler + R post tibial DVT; T12 compression fx  PMH significant of HTN, IIDM, CKD stage III, recent COVID infection,  Clinical Impression   Pt admitted secondary to problem above with deficits below. PTA patient was walking holding onto furniture in her home and no device outside home (pt reports recent fall walking in the park).  Pt currently requires min assist to walk without a device and minguard assist to walk with RW. Discussed with pt incr fall risk due to Rt foot drop and pt interested in following up with an orthotist (Will need MD referral).  Anticipate patient will benefit from PT to address problems listed below.Will continue to follow acutely to maximize functional mobility independence and safety.       Follow Up Recommendations Home health PT;Supervision for mobility/OOB    Equipment Recommendations  Rolling walker with 5" wheels    Recommendations for Other Services OT consult     Precautions / Restrictions Precautions Precautions: Fall      Mobility  Bed Mobility Overal bed mobility: Modified Independent             General bed mobility comments: incr time/effort    Transfers Overall transfer level: Needs assistance Equipment used: Rolling walker (2 wheeled);None Transfers: Sit to/from Stand Sit to Stand: Min assist         General transfer comment: initial transfer each time with posterior lean and LOB with return to sitting; on repetition, pt uses wide BOS and arms at low guard to gain her balance  Ambulation/Gait Ambulation/Gait assistance: Min assist;Min guard Gait Distance (Feet): 120 Feet (no device; 100 with RW) Assistive device: Rolling walker (2 wheeled);None Gait  Pattern/deviations: Step-through pattern;Decreased stance time - right;Decreased dorsiflexion - right;Steppage;Staggering left;Staggering right;Drifts right/left Gait velocity: decr   General Gait Details: pt caught her toe multiple times while walking wiht resulting imbalance; with RW no LOB  Stairs            Wheelchair Mobility    Modified Rankin (Stroke Patients Only)       Balance Overall balance assessment: Mild deficits observed, not formally tested                                           Pertinent Vitals/Pain Pain Assessment: No/denies pain    Home Living Family/patient expects to be discharged to:: Private residence Living Arrangements: Children Available Help at Discharge: Family;Available 24 hours/day Type of Home: House Home Access: Stairs to enter Entrance Stairs-Rails: Right Entrance Stairs-Number of Steps: 4 Home Layout: Multi-level;Able to live on main level with bedroom/bathroom;Laundry or work area in [employer] Equipment: Environmental consultant - 2 wheels;Cane - single point (reports her RW is in disrepair and not safe to use)      Prior Function Level of Independence: Needs assistance   Gait / Transfers Assistance Needed: "furniture walks" inside; fell walking in the park and tripped over seam in sidewalk (likely in part due to RT foot drop);  ADL's / Homemaking Assistance Needed: reports she does BADLs on her own; does not drive (son does)        Hand Dominance  Extremity/Trunk Assessment   Upper Extremity Assessment Upper Extremity Assessment: Overall WFL for tasks assessed    Lower Extremity Assessment Lower Extremity Assessment: RLE deficits/detail RLE Deficits / Details: rt foot drop RLE Sensation: decreased light touch    Cervical / Trunk Assessment Cervical / Trunk Assessment: Normal  Communication   Communication: No difficulties  Cognition Arousal/Alertness: Awake/alert Behavior During Therapy: WFL for  tasks assessed/performed Overall Cognitive Status: Within Functional Limits for tasks assessed                                 General Comments: noted confusion overnight      General Comments General comments (skin integrity, edema, etc.): Educated on types of dorsiflexion assist or support and pt agreeable to see an orthotist    Exercises     Assessment/Plan    PT Assessment Patient needs continued PT services  PT Problem List Decreased strength;Decreased balance;Decreased mobility;Decreased cognition;Decreased knowledge of use of DME;Decreased safety awareness;Decreased knowledge of precautions;Impaired sensation;Pain       PT Treatment Interventions DME instruction;Gait training;Stair training;Functional mobility training;Therapeutic activities;Therapeutic exercise;Balance training;Neuromuscular re-education;Cognitive remediation;Patient/family education    PT Goals (Current goals can be found in the Care Plan section)  Acute Rehab PT Goals Patient Stated Goal: to not fall PT Goal Formulation: With patient Time For Goal Achievement: 01/07/21 Potential to Achieve Goals: Good    Frequency Min 3X/week   Barriers to discharge        Co-evaluation               AM-PAC PT "6 Clicks" Mobility  Outcome Measure Help needed turning from your back to your side while in a flat bed without using bedrails?: None Help needed moving from lying on your back to sitting on the side of a flat bed without using bedrails?: A Little Help needed moving to and from a bed to a chair (including a wheelchair)?: A Little Help needed standing up from a chair using your arms (e.g., wheelchair or bedside chair)?: A Little Help needed to walk in hospital room?: A Little Help needed climbing 3-5 steps with a railing? : A Little 6 Click Score: 19    End of Session Equipment Utilized During Treatment: Gait belt Activity Tolerance: Patient tolerated treatment well Patient left:  in bed;with call bell/phone within reach;with bed alarm set Nurse Communication: Mobility status PT Visit Diagnosis: Unsteadiness on feet (R26.81);Muscle weakness (generalized) (M62.81);Other symptoms and signs involving the nervous system (O96.295)    Time: 2841-3244 PT Time Calculation (min) (ACUTE ONLY): 28 min   Charges:   PT Evaluation $PT Eval Moderate Complexity: 1 Mod PT Treatments $Gait Training: 8-22 mins         Jerolyn Center, PT Pager [phone]   Zena Amos 12/24/2020, 2:12 PM

## 2020-12-24 NOTE — Progress Notes (Signed)
*  PRELIMINARY RESULTS* Echocardiogram 2D Echocardiogram has been performed.  Neomia Dear RDCS 12/24/2020, 1:00 PM

## 2020-12-24 NOTE — Discharge Instructions (Addendum)
Information on my medicine - ELIQUIS (apixaban)   Why was Eliquis prescribed for you? Eliquis was prescribed to treat blood clots that may have been found in the veins of your legs (deep vein thrombosis) or in your lungs (pulmonary embolism) and to reduce the risk of them occurring again.  What do You need to know about Eliquis ? The starting dose is 10 mg (two 5 mg tablets) taken TWICE daily for the FIRST SEVEN (7) DAYS, then on 12/31/2020 the dose is reduced to ONE 5 mg tablet taken TWICE daily.  Eliquis may be taken with or without food.   Try to take the dose about the same time in the morning and in the evening. If you have difficulty swallowing the tablet whole please discuss with your pharmacist how to take the medication safely.  Take Eliquis exactly as prescribed and DO NOT stop taking Eliquis without talking to the doctor who prescribed the medication.  Stopping may increase your risk of developing a new blood clot.  Refill your prescription before you run out.  After discharge, you should have regular check-up appointments with your healthcare provider that is prescribing your Eliquis.    What do you do if you miss a dose? If a dose of ELIQUIS is not taken at the scheduled time, take it as soon as possible on the same day and twice-daily administration should be resumed. The dose should not be doubled to make up for a missed dose.  Important Safety Information A possible side effect of Eliquis is bleeding. You should call your healthcare provider right away if you experience any of the following: Bleeding from an injury or your nose that does not stop. Unusual colored urine (red or dark brown) or unusual colored stools (red or black). Unusual bruising for unknown reasons. A serious fall or if you hit your head (even if there is no bleeding).  Some medicines may interact with Eliquis and might increase your risk of bleeding or clotting while on Eliquis. To help avoid  this, consult your healthcare provider or pharmacist prior to using any new prescription or non-prescription medications, including herbals, vitamins, non-steroidal anti-inflammatory drugs (NSAIDs) and supplements.  This website has more information on Eliquis (apixaban): http://www.eliquis.com/eliquis/home

## 2020-12-24 NOTE — Progress Notes (Signed)
Initial Nutrition Assessment  DOCUMENTATION CODES:   Not applicable  INTERVENTION:   -Ensure Enlive po BID, each supplement provides 350 kcal and 20 grams of protein  -MVI with minerals daily  NUTRITION DIAGNOSIS:   Inadequate oral intake related to decreased appetite as evidenced by per patient/family report.  GOAL:   Patient will meet greater than or equal to 90% of their needs  MONITOR:   PO intake, Supplement acceptance, Labs, Weight trends, Skin, I & O's  REASON FOR ASSESSMENT:   Malnutrition Screening Tool    ASSESSMENT:   Amber Gonzales is a 87 y.o. female with medical history significant of HTN, IIDM, CKD stage III, with diet control, recent COVID infection, presented with worsening of chest pains.  Pt admitted with PE.    Spoke with pt and son at bedside. Per pt, she has experienced a general decline in health over the past 6 months, since COVID-19 diagnosis. Per pt, she is unable to eat a lot- she typically consumes 3 meals per day (Breakfast: oatmeal OR Eggs and toast; Lunch: sandwich; Dinner: meat, starch, and vegetable). Per pt, she ate some breakfast, but not very much. She has multiple missing teeth, as she has been waiting on dentures. Offered to mechanically downgrade diet, however, pt politely declined, stating that she has been able to select softer food items off of the menu.   Pt endorses gradual wt loss over the past year, however, unsure of UBW. Reviewed wt hx; pt has experienced a 9.2% wt loss over the past 2 months, which significant for time frame. Pt son endorses increased falls at home secondary to impaired balance (fall about 6 months ago resulted in a stress fracture).   Discussed importance of good meal and supplement intake to promote healing. Pt amenable to Ensure supplements, as son recently purchased them for her at home.   Pt with poor oral intake and would benefit from nutrient dense supplement. One Ensure Enlive supplement provides 350  kcals, 20 grams protein, and 44-45 grams of carbohydrate vs one Glucerna shake supplement, which provides 220 kcals, 10 grams of protein, and 26 grams of carbohydrate. Given pt's hx of DM, RD will reassess adequacy of PO intake, CBGS, and adjust supplement regimen as appropriate at follow-up.    Lab Results  Component Value Date   HGBA1C 6.6 06/25/2010   PTA DM medications are none.   Labs reviewed: CBGS: 89-143 (inpatient orders for glycemic control are 0-5 units insulin aspart daily at bedtime and 0-9 units insulin aspart TID with meals).    NUTRITION - FOCUSED PHYSICAL EXAM:  Flowsheet Row Most Recent Value  Orbital Region No depletion  Upper Arm Region Mild depletion  Thoracic and Lumbar Region No depletion  Buccal Region No depletion  Temple Region No depletion  Clavicle Bone Region No depletion  Clavicle and Acromion Bone Region No depletion  Scapular Bone Region No depletion  Dorsal Hand No depletion  Patellar Region Mild depletion  Anterior Thigh Region Mild depletion  Posterior Calf Region Mild depletion  Edema (RD Assessment) None  Hair Reviewed  Eyes Reviewed  Mouth Reviewed  Skin Reviewed  Nails Reviewed       Diet Order:   Diet Order             Diet heart healthy/carb modified Room service appropriate? Yes; Fluid consistency: Thin  Diet effective now                   EDUCATION NEEDS:  Education needs have been addressed  Skin:  Skin Assessment: Reviewed RN Assessment  Last BM:  12/23/20  Height:   Ht Readings from Last 1 Encounters:  12/22/20 5\' 2"  (1.575 m)    Weight:   Wt Readings from Last 1 Encounters:  12/22/20 54.5 kg    Ideal Body Weight:  50 kg  BMI:  Body mass index is 21.97 kg/m.  Estimated Nutritional Needs:   Kcal:  1450-1650  Protein:  70-85 grams  Fluid:  > 1.4 L    12/24/20, RD, LDN, CDCES Registered Dietitian II Certified Diabetes Care and Education Specialist Please refer to [facility] for RD and/or RD  on-call/weekend/after hours pager

## 2020-12-25 NOTE — TOC Progression Note (Addendum)
Transition of Care [facility]) - Progression Note    Patient Details  Name: Amber Gonzales MRN: [id number] Date of Birth: 1929/09/23  [facility] CM/SW Contact  Beckie Busing, RN Phone [phone]  12/25/2020, 8:50 AM  Clinical Narrative:    Patient has been discharged with [facility] PT orders. CM called son Johnye Kist to offer choice. HH PT has been set up with [facility] care.  No other needs noted at this time. TOC will sign off.         Expected Discharge Plan and Services           Expected Discharge Date: 12/24/20                                     Social Determinants of Health (SDOH) Interventions    Readmission Risk Interventions No flowsheet data found.

## 2020-12-28 DIAGNOSIS — M21371 Foot drop, right foot: Secondary | ICD-10-CM | POA: Diagnosis not present

## 2020-12-28 DIAGNOSIS — I10 Essential (primary) hypertension: Secondary | ICD-10-CM | POA: Diagnosis not present

## 2020-12-28 DIAGNOSIS — R350 Frequency of micturition: Secondary | ICD-10-CM | POA: Diagnosis not present

## 2020-12-28 DIAGNOSIS — I2699 Other pulmonary embolism without acute cor pulmonale: Secondary | ICD-10-CM | POA: Diagnosis not present

## 2020-12-28 DIAGNOSIS — E213 Hyperparathyroidism, unspecified: Secondary | ICD-10-CM | POA: Diagnosis not present

## 2020-12-28 DIAGNOSIS — N183 Chronic kidney disease, stage 3 unspecified: Secondary | ICD-10-CM | POA: Diagnosis not present

## 2021-01-03 DIAGNOSIS — E1122 Type 2 diabetes mellitus with diabetic chronic kidney disease: Secondary | ICD-10-CM | POA: Diagnosis not present

## 2021-01-03 DIAGNOSIS — N183 Chronic kidney disease, stage 3 unspecified: Secondary | ICD-10-CM | POA: Diagnosis not present

## 2021-01-03 DIAGNOSIS — I10 Essential (primary) hypertension: Secondary | ICD-10-CM | POA: Diagnosis not present

## 2021-01-03 DIAGNOSIS — M810 Age-related osteoporosis without current pathological fracture: Secondary | ICD-10-CM | POA: Diagnosis not present

## 2021-01-03 DIAGNOSIS — E7800 Pure hypercholesterolemia, unspecified: Secondary | ICD-10-CM | POA: Diagnosis not present

## 2021-01-03 DIAGNOSIS — E1169 Type 2 diabetes mellitus with other specified complication: Secondary | ICD-10-CM | POA: Diagnosis not present

## 2021-01-04 DIAGNOSIS — R634 Abnormal weight loss: Secondary | ICD-10-CM | POA: Diagnosis not present

## 2021-01-04 DIAGNOSIS — N1832 Chronic kidney disease, stage 3b: Secondary | ICD-10-CM | POA: Diagnosis not present

## 2021-01-04 DIAGNOSIS — N2581 Secondary hyperparathyroidism of renal origin: Secondary | ICD-10-CM | POA: Diagnosis not present

## 2021-01-04 DIAGNOSIS — W19XXXA Unspecified fall, initial encounter: Secondary | ICD-10-CM | POA: Diagnosis not present

## 2021-01-04 DIAGNOSIS — I129 Hypertensive chronic kidney disease with stage 1 through stage 4 chronic kidney disease, or unspecified chronic kidney disease: Secondary | ICD-10-CM | POA: Diagnosis not present

## 2021-01-08 ENCOUNTER — Other Ambulatory Visit (HOSPITAL_COMMUNITY): Payer: Self-pay

## 2021-01-08 ENCOUNTER — Telehealth (HOSPITAL_COMMUNITY): Payer: Self-pay

## 2021-01-08 NOTE — Telephone Encounter (Signed)
Pharmacy Transitions of Care Follow-up Telephone Call  Date of discharge: 12/24/20  Discharge Diagnosis: PE  How have you been since you were released from the hospital? Patient doing well. Feels she been getting better daily since discharge.  Medication changes made at discharge: START taking: Eliquis DVT/PE Starter Pack (Apixaban Starter Pack (10mg  and 5mg ))   Icon medications to stop taking  STOP taking: methocarbamol 500 MG tablet (ROBAXIN)  Molnupiravir 200 MG Caps   Medication changes verified by the patient? Yes    Medication Accessibility:  Home [facility]acy[facility] [place]    Was the patient provided with refills on discharged medications? No   Have all prescriptions been transferred from [facility] to home pharmacy? N/A   Is the patient able to afford medications? Has AARP   Medication Review:  APIXABAN (ELIQUIS)  Apixaban 10 mg BID initiated on 12/24/20. Will switch to apixaban 5 mg BID after 7 days (DATE 12/31/20).  - Discussed importance of taking medication around the same time everyday  - Advised patient of medications to avoid (NSAIDs, ASA)  - Educated that Tylenol (acetaminophen) will be the preferred analgesic to prevent risk of bleeding  - Emphasized importance of monitoring for signs and symptoms of bleeding (abnormal bruising, prolonged bleeding, nose bleeds, bleeding from gums, discolored urine, black tarry stools)  - Advised patient to alert all providers of anticoagulation therapy prior to starting a new medication or having a procedure   Follow-up Appointments:  PCP Hospital f/u appt confirmed? Went to office to see Dr. 12/26/20 last week. Not sure if she received maintenance refill on Eliquis or not. Will contact MD to make [facility]  If their condition worsens, is the pt aware to call PCP or go to the Emergency Dept.? yes  Final Patient Assessment: Patient doing well. Contacted MD to make sure Eliquis refills were sent to home pharmacy.

## 2021-01-16 ENCOUNTER — Other Ambulatory Visit: Payer: Self-pay

## 2021-01-16 ENCOUNTER — Ambulatory Visit: Payer: Medicare Other | Admitting: Podiatry

## 2021-01-16 DIAGNOSIS — B351 Tinea unguium: Secondary | ICD-10-CM

## 2021-01-16 DIAGNOSIS — E119 Type 2 diabetes mellitus without complications: Secondary | ICD-10-CM | POA: Diagnosis not present

## 2021-01-16 DIAGNOSIS — M79675 Pain in left toe(s): Secondary | ICD-10-CM

## 2021-01-16 DIAGNOSIS — E0843 Diabetes mellitus due to underlying condition with diabetic autonomic (poly)neuropathy: Secondary | ICD-10-CM

## 2021-01-16 DIAGNOSIS — M79674 Pain in right toe(s): Secondary | ICD-10-CM

## 2021-01-16 NOTE — Progress Notes (Signed)
   SUBJECTIVE Patient with a history of diabetes mellitus presents to office today complaining of elongated, thickened nails that cause pain while ambulating in shoes.  Patient is unable to trim their own nails. Patient is here for further evaluation and treatment.   Past Medical History:  Diagnosis Date   Diabetes mellitus without complication (HCC)    Hypertension    Renal disorder     OBJECTIVE General Patient is awake, alert, and oriented x 3 and in no acute distress. Derm Skin is dry and supple bilateral. Negative open lesions or macerations. Remaining integument unremarkable. Nails are tender, long, thickened and dystrophic with subungual debris, consistent with onychomycosis, 1-5 bilateral. No signs of infection noted. Vasc  DP and PT pedal pulses palpable bilaterally. Temperature gradient within normal limits.  Neuro Epicritic and protective threshold sensation diminished bilaterally.  Musculoskeletal Exam No symptomatic pedal deformities noted bilateral. Muscular strength within normal limits.  ASSESSMENT 1. Diabetes Mellitus w/ peripheral neuropathy 2.  Pain due to onychomycosis of toenails bilateral  PLAN OF CARE 1. Patient evaluated today. 2. Instructed to maintain good pedal hygiene and foot care. Stressed importance of controlling blood sugar.  3. Mechanical debridement of nails 1-5 bilaterally performed using a nail nipper. Filed with dremel without incident.  4. Return to clinic in 3 mos.     Felecia Shelling, DPM [facility]  Dr. Felecia Shelling, DPM    [address] [place], [place] [address]                Office [phone]  Fax [phone]

## 2021-01-23 DIAGNOSIS — N183 Chronic kidney disease, stage 3 unspecified: Secondary | ICD-10-CM | POA: Diagnosis not present

## 2021-01-23 DIAGNOSIS — E1169 Type 2 diabetes mellitus with other specified complication: Secondary | ICD-10-CM | POA: Diagnosis not present

## 2021-01-23 DIAGNOSIS — E7800 Pure hypercholesterolemia, unspecified: Secondary | ICD-10-CM | POA: Diagnosis not present

## 2021-01-23 DIAGNOSIS — E1122 Type 2 diabetes mellitus with diabetic chronic kidney disease: Secondary | ICD-10-CM | POA: Diagnosis not present

## 2021-01-23 DIAGNOSIS — M810 Age-related osteoporosis without current pathological fracture: Secondary | ICD-10-CM | POA: Diagnosis not present

## 2021-01-23 DIAGNOSIS — I10 Essential (primary) hypertension: Secondary | ICD-10-CM | POA: Diagnosis not present

## 2021-03-05 DIAGNOSIS — E1122 Type 2 diabetes mellitus with diabetic chronic kidney disease: Secondary | ICD-10-CM | POA: Diagnosis not present

## 2021-03-05 DIAGNOSIS — M810 Age-related osteoporosis without current pathological fracture: Secondary | ICD-10-CM | POA: Diagnosis not present

## 2021-03-05 DIAGNOSIS — I10 Essential (primary) hypertension: Secondary | ICD-10-CM | POA: Diagnosis not present

## 2021-03-05 DIAGNOSIS — E1169 Type 2 diabetes mellitus with other specified complication: Secondary | ICD-10-CM | POA: Diagnosis not present

## 2021-03-05 DIAGNOSIS — N183 Chronic kidney disease, stage 3 unspecified: Secondary | ICD-10-CM | POA: Diagnosis not present

## 2021-03-05 DIAGNOSIS — E7800 Pure hypercholesterolemia, unspecified: Secondary | ICD-10-CM | POA: Diagnosis not present

## 2021-04-03 DIAGNOSIS — E7800 Pure hypercholesterolemia, unspecified: Secondary | ICD-10-CM | POA: Diagnosis not present

## 2021-04-03 DIAGNOSIS — Z86711 Personal history of pulmonary embolism: Secondary | ICD-10-CM | POA: Diagnosis not present

## 2021-04-03 DIAGNOSIS — E213 Hyperparathyroidism, unspecified: Secondary | ICD-10-CM | POA: Diagnosis not present

## 2021-04-03 DIAGNOSIS — I10 Essential (primary) hypertension: Secondary | ICD-10-CM | POA: Diagnosis not present

## 2021-04-03 DIAGNOSIS — N183 Chronic kidney disease, stage 3 unspecified: Secondary | ICD-10-CM | POA: Diagnosis not present

## 2021-04-03 DIAGNOSIS — Z6822 Body mass index (BMI) 22.0-22.9, adult: Secondary | ICD-10-CM | POA: Diagnosis not present

## 2021-04-03 DIAGNOSIS — E1122 Type 2 diabetes mellitus with diabetic chronic kidney disease: Secondary | ICD-10-CM | POA: Diagnosis not present

## 2021-04-22 DIAGNOSIS — I10 Essential (primary) hypertension: Secondary | ICD-10-CM | POA: Diagnosis not present

## 2021-04-22 DIAGNOSIS — E7800 Pure hypercholesterolemia, unspecified: Secondary | ICD-10-CM | POA: Diagnosis not present

## 2021-04-22 DIAGNOSIS — E1169 Type 2 diabetes mellitus with other specified complication: Secondary | ICD-10-CM | POA: Diagnosis not present

## 2021-04-22 DIAGNOSIS — M810 Age-related osteoporosis without current pathological fracture: Secondary | ICD-10-CM | POA: Diagnosis not present

## 2021-04-22 DIAGNOSIS — N183 Chronic kidney disease, stage 3 unspecified: Secondary | ICD-10-CM | POA: Diagnosis not present

## 2021-04-22 DIAGNOSIS — E1122 Type 2 diabetes mellitus with diabetic chronic kidney disease: Secondary | ICD-10-CM | POA: Diagnosis not present

## 2021-04-23 ENCOUNTER — Ambulatory Visit: Payer: Medicare Other | Admitting: Podiatry

## 2021-05-14 DIAGNOSIS — N183 Chronic kidney disease, stage 3 unspecified: Secondary | ICD-10-CM | POA: Diagnosis not present

## 2021-05-14 DIAGNOSIS — I10 Essential (primary) hypertension: Secondary | ICD-10-CM | POA: Diagnosis not present

## 2021-05-14 DIAGNOSIS — E213 Hyperparathyroidism, unspecified: Secondary | ICD-10-CM | POA: Diagnosis not present

## 2021-05-29 DIAGNOSIS — E1169 Type 2 diabetes mellitus with other specified complication: Secondary | ICD-10-CM | POA: Diagnosis not present

## 2021-05-29 DIAGNOSIS — I10 Essential (primary) hypertension: Secondary | ICD-10-CM | POA: Diagnosis not present

## 2021-05-29 DIAGNOSIS — M810 Age-related osteoporosis without current pathological fracture: Secondary | ICD-10-CM | POA: Diagnosis not present

## 2021-05-29 DIAGNOSIS — N183 Chronic kidney disease, stage 3 unspecified: Secondary | ICD-10-CM | POA: Diagnosis not present

## 2021-05-29 DIAGNOSIS — E7800 Pure hypercholesterolemia, unspecified: Secondary | ICD-10-CM | POA: Diagnosis not present

## 2021-05-29 DIAGNOSIS — E1122 Type 2 diabetes mellitus with diabetic chronic kidney disease: Secondary | ICD-10-CM | POA: Diagnosis not present

## 2021-07-16 DIAGNOSIS — N183 Chronic kidney disease, stage 3 unspecified: Secondary | ICD-10-CM | POA: Diagnosis not present

## 2021-07-16 DIAGNOSIS — Z23 Encounter for immunization: Secondary | ICD-10-CM | POA: Diagnosis not present

## 2021-07-16 DIAGNOSIS — I10 Essential (primary) hypertension: Secondary | ICD-10-CM | POA: Diagnosis not present

## 2021-07-16 DIAGNOSIS — N1832 Chronic kidney disease, stage 3b: Secondary | ICD-10-CM | POA: Diagnosis not present

## 2021-07-25 DIAGNOSIS — N1832 Chronic kidney disease, stage 3b: Secondary | ICD-10-CM | POA: Diagnosis not present

## 2021-07-25 DIAGNOSIS — I129 Hypertensive chronic kidney disease with stage 1 through stage 4 chronic kidney disease, or unspecified chronic kidney disease: Secondary | ICD-10-CM | POA: Diagnosis not present

## 2021-07-25 DIAGNOSIS — N2581 Secondary hyperparathyroidism of renal origin: Secondary | ICD-10-CM | POA: Diagnosis not present

## 2021-07-25 DIAGNOSIS — R634 Abnormal weight loss: Secondary | ICD-10-CM | POA: Diagnosis not present

## 2021-08-01 DIAGNOSIS — E7800 Pure hypercholesterolemia, unspecified: Secondary | ICD-10-CM | POA: Diagnosis not present

## 2021-08-01 DIAGNOSIS — E1169 Type 2 diabetes mellitus with other specified complication: Secondary | ICD-10-CM | POA: Diagnosis not present

## 2021-08-01 DIAGNOSIS — M810 Age-related osteoporosis without current pathological fracture: Secondary | ICD-10-CM | POA: Diagnosis not present

## 2021-08-01 DIAGNOSIS — N183 Chronic kidney disease, stage 3 unspecified: Secondary | ICD-10-CM | POA: Diagnosis not present

## 2021-08-01 DIAGNOSIS — E1122 Type 2 diabetes mellitus with diabetic chronic kidney disease: Secondary | ICD-10-CM | POA: Diagnosis not present

## 2021-08-01 DIAGNOSIS — I10 Essential (primary) hypertension: Secondary | ICD-10-CM | POA: Diagnosis not present

## 2021-09-12 DIAGNOSIS — E7800 Pure hypercholesterolemia, unspecified: Secondary | ICD-10-CM | POA: Diagnosis not present

## 2021-09-12 DIAGNOSIS — I10 Essential (primary) hypertension: Secondary | ICD-10-CM | POA: Diagnosis not present

## 2021-09-12 DIAGNOSIS — E1169 Type 2 diabetes mellitus with other specified complication: Secondary | ICD-10-CM | POA: Diagnosis not present

## 2021-09-18 DIAGNOSIS — I10 Essential (primary) hypertension: Secondary | ICD-10-CM | POA: Diagnosis not present

## 2021-09-18 DIAGNOSIS — E1122 Type 2 diabetes mellitus with diabetic chronic kidney disease: Secondary | ICD-10-CM | POA: Diagnosis not present

## 2021-10-02 DIAGNOSIS — N189 Chronic kidney disease, unspecified: Secondary | ICD-10-CM | POA: Diagnosis not present

## 2021-10-02 DIAGNOSIS — N1832 Chronic kidney disease, stage 3b: Secondary | ICD-10-CM | POA: Diagnosis not present

## 2021-10-08 DIAGNOSIS — N1832 Chronic kidney disease, stage 3b: Secondary | ICD-10-CM | POA: Diagnosis not present

## 2021-10-08 DIAGNOSIS — R634 Abnormal weight loss: Secondary | ICD-10-CM | POA: Diagnosis not present

## 2021-10-08 DIAGNOSIS — I129 Hypertensive chronic kidney disease with stage 1 through stage 4 chronic kidney disease, or unspecified chronic kidney disease: Secondary | ICD-10-CM | POA: Diagnosis not present

## 2021-10-08 DIAGNOSIS — N2581 Secondary hyperparathyroidism of renal origin: Secondary | ICD-10-CM | POA: Diagnosis not present

## 2021-11-03 ENCOUNTER — Other Ambulatory Visit: Payer: Self-pay

## 2021-11-03 ENCOUNTER — Emergency Department (HOSPITAL_BASED_OUTPATIENT_CLINIC_OR_DEPARTMENT_OTHER): Payer: Medicare Other

## 2021-11-03 ENCOUNTER — Encounter (HOSPITAL_BASED_OUTPATIENT_CLINIC_OR_DEPARTMENT_OTHER): Payer: Self-pay | Admitting: Emergency Medicine

## 2021-11-03 ENCOUNTER — Inpatient Hospital Stay (HOSPITAL_BASED_OUTPATIENT_CLINIC_OR_DEPARTMENT_OTHER)
Admission: EM | Admit: 2021-11-03 | Discharge: 2021-11-05 | DRG: 065 | Disposition: A | Discharge status: Home / Self Care | Payer: Medicare Other | Attending: Internal Medicine | Admitting: Internal Medicine

## 2021-11-03 DIAGNOSIS — Z7901 Long term (current) use of anticoagulants: Secondary | ICD-10-CM | POA: Diagnosis not present

## 2021-11-03 DIAGNOSIS — I6522 Occlusion and stenosis of left carotid artery: Secondary | ICD-10-CM | POA: Diagnosis not present

## 2021-11-03 DIAGNOSIS — I672 Cerebral atherosclerosis: Secondary | ICD-10-CM | POA: Diagnosis present

## 2021-11-03 DIAGNOSIS — F0390 Unspecified dementia without behavioral disturbance: Secondary | ICD-10-CM | POA: Diagnosis present

## 2021-11-03 DIAGNOSIS — I429 Cardiomyopathy, unspecified: Secondary | ICD-10-CM | POA: Diagnosis not present

## 2021-11-03 DIAGNOSIS — N1832 Chronic kidney disease, stage 3b: Secondary | ICD-10-CM | POA: Diagnosis present

## 2021-11-03 DIAGNOSIS — R270 Ataxia, unspecified: Secondary | ICD-10-CM | POA: Diagnosis not present

## 2021-11-03 DIAGNOSIS — I63542 Cerebral infarction due to unspecified occlusion or stenosis of left cerebellar artery: Secondary | ICD-10-CM | POA: Diagnosis not present

## 2021-11-03 DIAGNOSIS — I5020 Unspecified systolic (congestive) heart failure: Secondary | ICD-10-CM | POA: Diagnosis present

## 2021-11-03 DIAGNOSIS — E119 Type 2 diabetes mellitus without complications: Secondary | ICD-10-CM | POA: Diagnosis not present

## 2021-11-03 DIAGNOSIS — E785 Hyperlipidemia, unspecified: Secondary | ICD-10-CM | POA: Diagnosis present

## 2021-11-03 DIAGNOSIS — Z8249 Family history of ischemic heart disease and other diseases of the circulatory system: Secondary | ICD-10-CM

## 2021-11-03 DIAGNOSIS — R29700 NIHSS score 0: Secondary | ICD-10-CM | POA: Diagnosis present

## 2021-11-03 DIAGNOSIS — I6389 Other cerebral infarction: Secondary | ICD-10-CM | POA: Diagnosis not present

## 2021-11-03 DIAGNOSIS — I10 Essential (primary) hypertension: Secondary | ICD-10-CM | POA: Diagnosis present

## 2021-11-03 DIAGNOSIS — E876 Hypokalemia: Secondary | ICD-10-CM | POA: Diagnosis not present

## 2021-11-03 DIAGNOSIS — G8194 Hemiplegia, unspecified affecting left nondominant side: Secondary | ICD-10-CM | POA: Diagnosis present

## 2021-11-03 DIAGNOSIS — I639 Cerebral infarction, unspecified: Secondary | ICD-10-CM | POA: Diagnosis not present

## 2021-11-03 DIAGNOSIS — R42 Dizziness and giddiness: Secondary | ICD-10-CM | POA: Diagnosis not present

## 2021-11-03 DIAGNOSIS — I447 Left bundle-branch block, unspecified: Secondary | ICD-10-CM | POA: Diagnosis present

## 2021-11-03 DIAGNOSIS — E1122 Type 2 diabetes mellitus with diabetic chronic kidney disease: Secondary | ICD-10-CM | POA: Diagnosis present

## 2021-11-03 DIAGNOSIS — I671 Cerebral aneurysm, nonruptured: Secondary | ICD-10-CM | POA: Diagnosis present

## 2021-11-03 DIAGNOSIS — I6502 Occlusion and stenosis of left vertebral artery: Secondary | ICD-10-CM | POA: Diagnosis not present

## 2021-11-03 DIAGNOSIS — R29898 Other symptoms and signs involving the musculoskeletal system: Secondary | ICD-10-CM | POA: Diagnosis not present

## 2021-11-03 DIAGNOSIS — Z79899 Other long term (current) drug therapy: Secondary | ICD-10-CM | POA: Diagnosis not present

## 2021-11-03 DIAGNOSIS — Z86711 Personal history of pulmonary embolism: Secondary | ICD-10-CM | POA: Diagnosis present

## 2021-11-03 DIAGNOSIS — N183 Chronic kidney disease, stage 3 unspecified: Secondary | ICD-10-CM | POA: Diagnosis present

## 2021-11-03 DIAGNOSIS — R931 Abnormal findings on diagnostic imaging of heart and coronary circulation: Secondary | ICD-10-CM | POA: Diagnosis not present

## 2021-11-03 DIAGNOSIS — N1831 Chronic kidney disease, stage 3a: Secondary | ICD-10-CM | POA: Diagnosis not present

## 2021-11-03 DIAGNOSIS — I130 Hypertensive heart and chronic kidney disease with heart failure and stage 1 through stage 4 chronic kidney disease, or unspecified chronic kidney disease: Secondary | ICD-10-CM | POA: Diagnosis not present

## 2021-11-03 LAB — CBC
HCT: 34.3 % — ABNORMAL LOW (ref 36.0–46.0)
Hemoglobin: 11.2 g/dL — ABNORMAL LOW (ref 12.0–15.0)
MCH: 32.9 pg (ref 26.0–34.0)
MCHC: 32.7 g/dL (ref 30.0–36.0)
MCV: 100.9 fL — ABNORMAL HIGH (ref 80.0–100.0)
Platelets: 206 10*3/uL (ref 150–400)
RBC: 3.4 MIL/uL — ABNORMAL LOW (ref 3.87–5.11)
RDW: 11.9 % (ref 11.5–15.5)
WBC: 4.9 10*3/uL (ref 4.0–10.5)
nRBC: 0 % (ref 0.0–0.2)

## 2021-11-03 LAB — URINALYSIS, ROUTINE W REFLEX MICROSCOPIC
Bilirubin Urine: NEGATIVE
Glucose, UA: NEGATIVE mg/dL
Hgb urine dipstick: NEGATIVE
Ketones, ur: NEGATIVE mg/dL
Nitrite: NEGATIVE
Protein, ur: NEGATIVE mg/dL
Specific Gravity, Urine: 1.01 (ref 1.005–1.030)
pH: 6.5 (ref 5.0–8.0)

## 2021-11-03 LAB — BASIC METABOLIC PANEL
Anion gap: 9 (ref 5–15)
BUN: 17 mg/dL (ref 8–23)
CO2: 28 mmol/L (ref 22–32)
Calcium: 9.1 mg/dL (ref 8.9–10.3)
Chloride: 104 mmol/L (ref 98–111)
Creatinine, Ser: 1.35 mg/dL — ABNORMAL HIGH (ref 0.44–1.00)
GFR, Estimated: 38 mL/min — ABNORMAL LOW (ref >60)
Glucose, Bld: 111 mg/dL — ABNORMAL HIGH (ref 70–99)
Potassium: 3.2 mmol/L — ABNORMAL LOW (ref 3.5–5.1)
Sodium: 141 mmol/L (ref 135–145)

## 2021-11-03 LAB — TROPONIN I (HIGH SENSITIVITY)
Troponin I (High Sensitivity): 13 ng/L (ref <18)
Troponin I (High Sensitivity): 14 ng/L (ref <18)

## 2021-11-03 LAB — CBG MONITORING, ED: Glucose-Capillary: 106 mg/dL — ABNORMAL HIGH (ref 70–99)

## 2021-11-03 MED ORDER — IOHEXOL 350 MG/ML SOLN
100.0000 mL | Freq: Once | INTRAVENOUS | Status: AC | PRN
  Administered 2021-11-03: 75 mL via INTRAVENOUS

## 2021-11-03 MED ORDER — ASPIRIN 325 MG PO TABS
325.0000 mg | ORAL_TABLET | Freq: Once | ORAL | Status: AC
  Administered 2021-11-03: 325 mg via ORAL
  Filled 2021-11-03: qty 1

## 2021-11-03 NOTE — Progress Notes (Signed)
Brief Neuro phone conversation: ? ?Briefly, Ms. ADELI FROST is a 88 y.o. female who presents with 2-3 day hx of LLE weakness and Vertigo. CTH with acute L cerebellum infarct. She was on Eliquis in the past but this was for PE. Per discussion with the ED team, she is no longer on Eliquis. ? ?Recs: ?- Medicine admission at [facility] with neurology consult. ?- Please page neurology when patient is admitted to [facility]. ?- Frequent Neuro checks per stroke unit protocol ?- Recommend brain imaging with MRI Brain without contrast ?- Recommend obtaining TTE ?- Recommend obtaining Lipid panel with LDL ?- Please start statin if LDL > 70 ?- Recommend HbA1c ?- Antithrombotic - Aspirin 81mg  daily. ?- Recommend DVT ppx ?- SBP goal - permissive hypertension first 24 h < 220/110. Held home meds.  ?- Recommend Telemetry monitoring for arrythmia ?- Recommend bedside swallow screen prior to PO intake. ?- Stroke education booklet ?- Recommend PT/OT/SLP consult ? ? ? ? ?[facility] ?Pager Number Erick Blinks ? ? ?

## 2021-11-03 NOTE — ED Provider Notes (Signed)
?[facility] ?Provider Note ? ? ?CSN: [id number] ?Arrival date & time: 11/03/21  1543 ? ?  ? ?History ? ?Chief Complaint  ?Patient presents with  ? Dizziness  ? ? ?Amber Gonzales is a 88 y.o. female with chief complaint of dizziness and left lower extremity weakness for the last 2-3 days.  Dizziness usually brought on from activities such as walking, and relieved with rest.  Denies active dizziness.  States when she walks, she feels like she is "walking drunk".  Denies chest pain or shortness of breath.  Denies numbness or tingling of the extremities.  Denies seizures, loss of consciousness, presyncope, lightheadedness.  Denies facial asymmetry, vision changes, fever, recent illness.  No prior history of this.  No Hx of BPPV.  Hx of diabetes mellitus, non-insulin-dependent and well controlled.  No abdominal pain, N/V, diarrhea, constipation. ? ?The history is provided by the patient and medical records.  ?Dizziness ? ?  ? ?Home Medications ?Prior to Admission medications   ?Medication Sig Start Date End Date Taking? Authorizing Provider  ?acetaminophen (TYLENOL) 500 MG tablet Take 1,000 mg by mouth every 6 (six) hours as needed for mild pain, fever or headache.    [provider]  ?APIXABAN Arne Cleveland) VTE STARTER PACK (10MG  AND 5MG ) Take as directed on package: start with two-5mg  tablets twice daily for 7 days. On day 8, switch to one-5mg  tablet twice daily. 12/24/20   Donne Hazel, MD  ?atenolol (TENORMIN) 50 MG tablet Take 50 mg by mouth daily.    [provider]  ?Cholecalciferol (VITAMIN D3 PO) Take 1 capsule by mouth daily.    [provider]  ?hydrALAZINE (APRESOLINE) 25 MG tablet Take 25 mg by mouth 2 (two) times daily.    [provider]  ?   ? ?Allergies    ?Patient has no known allergies.   ? ?Review of Systems   ?Review of Systems  ?Neurological:  Positive for dizziness.  ? ?Physical Exam ?Updated Vital Signs ?BP (!) 181/70   Pulse 60   Temp  98.7 ?F (37.1 ?C)   Resp 13   Ht 5\' 2"  (1.575 m)   Wt 53.1 kg   SpO2 97%   BMI 21.40 kg/m?  ?Physical Exam ?Vitals and nursing note reviewed.  ?Constitutional:   ?   General: She is not in acute distress. ?   Appearance: Normal appearance. She is well-developed. She is not ill-appearing or diaphoretic.  ?HENT:  ?   Head: Normocephalic and atraumatic.  ?Eyes:  ?   General: Lids are normal. Vision grossly intact. Gaze aligned appropriately. No visual field deficit. ?   Extraocular Movements: Extraocular movements intact.  ?   Conjunctiva/sclera: Conjunctivae normal.  ?   Pupils: Pupils are equal, round, and reactive to light.  ?   Visual Fields: Right eye visual fields normal and left eye visual fields normal.  ?Cardiovascular:  ?   Rate and Rhythm: Normal rate and regular rhythm.  ?   Pulses: Normal pulses.  ?   Heart sounds: No murmur heard. ?   Comments: Sinus arrhythmia, without obvious murmur ?Pulmonary:  ?   Effort: Pulmonary effort is normal. No respiratory distress.  ?   Breath sounds: Normal breath sounds.  ?Chest:  ?   Chest wall: No tenderness.  ?Abdominal:  ?   Palpations: Abdomen is soft.  ?   Tenderness: There is no abdominal tenderness. There is no guarding.  ?Musculoskeletal:     ?  General: No swelling.  ?   Cervical back: Neck supple. No rigidity.  ?   Right lower leg: No edema.  ?   Left lower leg: No edema.  ?Skin: ?   General: Skin is warm and dry.  ?   Capillary Refill: Capillary refill takes less than 2 seconds.  ?   Coloration: Skin is not jaundiced.  ?Neurological:  ?   Mental Status: She is alert and oriented to person, place, and time.  ?   GCS: GCS eye subscore is 4. GCS verbal subscore is 5. GCS motor subscore is 6.  ?   Cranial Nerves: No dysarthria or facial asymmetry.  ?   Sensory: No sensory deficit.  ?   Motor: Weakness (Subjective of LLE) present. No tremor, seizure activity or pronator drift.  ?   Coordination: Coordination normal. Finger-Nose-Finger Test and Heel to [place] Test  normal.  ?   Comments: Gait not fully assessed due to patient's level of dizziness.  Subjective weakness noted of the left lower extremity, does not appear obvious or significant on exam.  CN III--XII overall intact.  No evidence of dysarthria or aphasia, facial asymmetry, pronator drift, abnormal EOMs, normal coordination.    ?Psychiatric:     ?   Mood and Affect: Mood normal.  ? ? ?ED Results / Procedures / Treatments   ?Labs ?(all labs ordered are listed, but only abnormal results are displayed) ?Labs Reviewed  ?BASIC METABOLIC PANEL - Abnormal; Notable for the following components:  ?    Result Value  ? Potassium 3.2 (*)   ? Glucose, Bld 111 (*)   ? Creatinine, Ser 1.35 (*)   ? GFR, Estimated 38 (*)   ? All other components within normal limits  ?CBC - Abnormal; Notable for the following components:  ? RBC 3.40 (*)   ? Hemoglobin 11.2 (*)   ? HCT 34.3 (*)   ? MCV 100.9 (*)   ? All other components within normal limits  ?URINALYSIS, ROUTINE W REFLEX MICROSCOPIC - Abnormal; Notable for the following components:  ? Leukocytes,Ua MODERATE (*)   ? Non Squamous Epithelial 0-5 (*)   ? All other components within normal limits  ?CBG MONITORING, ED - Abnormal; Notable for the following components:  ? Glucose-Capillary 106 (*)   ? All other components within normal limits  ?URINE CULTURE  ?TROPONIN I (HIGH SENSITIVITY)  ?TROPONIN I (HIGH SENSITIVITY)  ? ? ?EKG ?EKG Interpretation ? ?Date/Time:  Sunday November 03 2021 16:06:25 EDT ?Ventricular Rate:  78 ?PR Interval:  174 ?QRS Duration: 134 ?QT Interval:  406 ?QTC Calculation: 462 ?R Axis:   -77 ?Text Interpretation: Normal sinus rhythm with sinus arrhythmia Left axis deviation Left bundle branch block Abnormal ECG When compared with ECG of 22-Dec-2020 09:10, PREVIOUS ECG IS PRESENT Confirmed by Godfrey Pick [phone]) on 11/03/2021 4:22:07 PM ? ?Radiology ?CT ANGIO HEAD NECK W WO CM ? ?Result Date: 11/03/2021 ?CLINICAL DATA:  Dizziness EXAM: CT ANGIOGRAPHY HEAD AND NECK TECHNIQUE:  Multidetector CT imaging of the head and neck was performed using the standard protocol during bolus administration of intravenous contrast. Multiplanar CT image reconstructions and MIPs were obtained to evaluate the vascular anatomy. Carotid stenosis measurements (when applicable) are obtained utilizing NASCET criteria, using the distal internal carotid diameter as the denominator. RADIATION DOSE REDUCTION: This exam was performed according to the departmental dose-optimization program which includes automated exposure control, adjustment of the mA and/or kV according to patient size and/or use of iterative reconstruction technique.  CONTRAST:  75mL OMNIPAQUE IOHEXOL 350 MG/ML SOLN COMPARISON:  CT head 12/10/2020 FINDINGS: CT HEAD FINDINGS Brain: Hypodensity and loss of the sulci in the left cerebellum, predominantly posteriorly and inferiorly, as well as the left inferior vermis, which is new from the prior exam. No other new hypodensity. No acute hemorrhage, mass, mass effect, or midline shift. No hydrocephalus. The fourth ventricle remains patent. Periventricular white matter changes, likely the sequela of chronic small vessel ischemic disease. Vascular: No hyperdense vessel. Atherosclerotic calcifications in the intracranial carotid and vertebral arteries. Skull: Hyperostosis frontalis.  No acute fracture or focal lesion. Sinuses: Imaged portions are clear. Orbits: Status post right lens replacement. Review of the MIP images confirms the above findings CTA NECK FINDINGS Aortic arch: Standard branching. Imaged portion shows no evidence of aneurysm or dissection. No significant stenosis of the major arch vessel origins. Right carotid system: No evidence of dissection, occlusion, or hemodynamically significant stenosis (greater than 50%). Left carotid system: Proximally 50% stenosis of the proximal left ICA. No dissection or occlusion. Vertebral arteries: Severe stenosis at the origin of the left vertebral artery  and in the proximal V4 segment. The remainder of the left vertebral artery is patent skull base. The right vertebral artery is patent from its origin to the skull base. No dissection or occlusion. Skeleton:

## 2021-11-03 NOTE — ED Triage Notes (Signed)
Pt reports dizziness x 3 days , Hx HTN . Denies headache nor vision changes .  ?Ambulatory with assistance due to dizziness , no weakness reported  ?

## 2021-11-03 NOTE — Progress Notes (Signed)
  [facility] will assume care on arrival to accepting facility. Until arrival, care as per EDP. However, [facility] available 24/7 for questions and assistance.   Nursing staff please page [facility] Admits and Consults ([phone]) as soon as the patient arrives to the hospital.  Nataleah Scioneaux, DO [facility]  

## 2021-11-04 ENCOUNTER — Observation Stay (HOSPITAL_COMMUNITY): Payer: Medicare Other

## 2021-11-04 DIAGNOSIS — R270 Ataxia, unspecified: Secondary | ICD-10-CM | POA: Diagnosis present

## 2021-11-04 DIAGNOSIS — I447 Left bundle-branch block, unspecified: Secondary | ICD-10-CM | POA: Diagnosis present

## 2021-11-04 DIAGNOSIS — R42 Dizziness and giddiness: Secondary | ICD-10-CM

## 2021-11-04 DIAGNOSIS — I130 Hypertensive heart and chronic kidney disease with heart failure and stage 1 through stage 4 chronic kidney disease, or unspecified chronic kidney disease: Secondary | ICD-10-CM | POA: Diagnosis present

## 2021-11-04 DIAGNOSIS — N1832 Chronic kidney disease, stage 3b: Secondary | ICD-10-CM | POA: Diagnosis present

## 2021-11-04 DIAGNOSIS — N183 Chronic kidney disease, stage 3 unspecified: Secondary | ICD-10-CM | POA: Diagnosis present

## 2021-11-04 DIAGNOSIS — G8194 Hemiplegia, unspecified affecting left nondominant side: Secondary | ICD-10-CM | POA: Diagnosis present

## 2021-11-04 DIAGNOSIS — Z7901 Long term (current) use of anticoagulants: Secondary | ICD-10-CM | POA: Diagnosis not present

## 2021-11-04 DIAGNOSIS — I639 Cerebral infarction, unspecified: Secondary | ICD-10-CM

## 2021-11-04 DIAGNOSIS — I671 Cerebral aneurysm, nonruptured: Secondary | ICD-10-CM | POA: Diagnosis present

## 2021-11-04 DIAGNOSIS — R931 Abnormal findings on diagnostic imaging of heart and coronary circulation: Secondary | ICD-10-CM | POA: Diagnosis not present

## 2021-11-04 DIAGNOSIS — I6389 Other cerebral infarction: Secondary | ICD-10-CM

## 2021-11-04 DIAGNOSIS — E1122 Type 2 diabetes mellitus with diabetic chronic kidney disease: Secondary | ICD-10-CM | POA: Diagnosis present

## 2021-11-04 DIAGNOSIS — E119 Type 2 diabetes mellitus without complications: Secondary | ICD-10-CM

## 2021-11-04 DIAGNOSIS — E785 Hyperlipidemia, unspecified: Secondary | ICD-10-CM | POA: Diagnosis present

## 2021-11-04 DIAGNOSIS — I10 Essential (primary) hypertension: Secondary | ICD-10-CM | POA: Diagnosis not present

## 2021-11-04 DIAGNOSIS — E876 Hypokalemia: Secondary | ICD-10-CM | POA: Diagnosis present

## 2021-11-04 DIAGNOSIS — Z8249 Family history of ischemic heart disease and other diseases of the circulatory system: Secondary | ICD-10-CM | POA: Diagnosis not present

## 2021-11-04 DIAGNOSIS — Z79899 Other long term (current) drug therapy: Secondary | ICD-10-CM | POA: Diagnosis not present

## 2021-11-04 DIAGNOSIS — I5020 Unspecified systolic (congestive) heart failure: Secondary | ICD-10-CM

## 2021-11-04 DIAGNOSIS — R29898 Other symptoms and signs involving the musculoskeletal system: Secondary | ICD-10-CM

## 2021-11-04 DIAGNOSIS — N1831 Chronic kidney disease, stage 3a: Secondary | ICD-10-CM | POA: Diagnosis not present

## 2021-11-04 DIAGNOSIS — R29700 NIHSS score 0: Secondary | ICD-10-CM | POA: Diagnosis present

## 2021-11-04 DIAGNOSIS — I63542 Cerebral infarction due to unspecified occlusion or stenosis of left cerebellar artery: Secondary | ICD-10-CM | POA: Diagnosis present

## 2021-11-04 DIAGNOSIS — F0390 Unspecified dementia without behavioral disturbance: Secondary | ICD-10-CM | POA: Diagnosis present

## 2021-11-04 DIAGNOSIS — Z86711 Personal history of pulmonary embolism: Secondary | ICD-10-CM | POA: Diagnosis not present

## 2021-11-04 DIAGNOSIS — I429 Cardiomyopathy, unspecified: Secondary | ICD-10-CM | POA: Diagnosis present

## 2021-11-04 DIAGNOSIS — I672 Cerebral atherosclerosis: Secondary | ICD-10-CM | POA: Diagnosis present

## 2021-11-04 LAB — BASIC METABOLIC PANEL
Anion gap: 9 (ref 5–15)
BUN: 13 mg/dL (ref 8–23)
CO2: 28 mmol/L (ref 22–32)
Calcium: 8.8 mg/dL — ABNORMAL LOW (ref 8.9–10.3)
Chloride: 104 mmol/L (ref 98–111)
Creatinine, Ser: 1.28 mg/dL — ABNORMAL HIGH (ref 0.44–1.00)
GFR, Estimated: 40 mL/min — ABNORMAL LOW (ref >60)
Glucose, Bld: 86 mg/dL (ref 70–99)
Potassium: 3.3 mmol/L — ABNORMAL LOW (ref 3.5–5.1)
Sodium: 141 mmol/L (ref 135–145)

## 2021-11-04 LAB — ECHOCARDIOGRAM COMPLETE
AR max vel: 2.34 cm2
AV Peak grad: 4.8 mmHg
Ao pk vel: 1.1 m/s
Area-P 1/2: 2.99 cm2
Height: 62 in
S' Lateral: 3.2 cm
Single Plane A4C EF: 30.2 %
Weight: 1872 oz

## 2021-11-04 LAB — LIPID PANEL
Cholesterol: 192 mg/dL (ref 0–200)
HDL: 54 mg/dL (ref >40)
LDL Cholesterol: 127 mg/dL — ABNORMAL HIGH (ref 0–99)
Total CHOL/HDL Ratio: 3.6 RATIO
Triglycerides: 53 mg/dL (ref <150)
VLDL: 11 mg/dL (ref 0–40)

## 2021-11-04 LAB — CBC WITH DIFFERENTIAL/PLATELET
Abs Immature Granulocytes: 0.04 10*3/uL (ref 0.00–0.07)
Basophils Absolute: 0.1 10*3/uL (ref 0.0–0.1)
Basophils Relative: 1 %
Eosinophils Absolute: 0.1 10*3/uL (ref 0.0–0.5)
Eosinophils Relative: 2 %
HCT: 34 % — ABNORMAL LOW (ref 36.0–46.0)
Hemoglobin: 11.5 g/dL — ABNORMAL LOW (ref 12.0–15.0)
Immature Granulocytes: 1 %
Lymphocytes Relative: 26 %
Lymphs Abs: 1.5 10*3/uL (ref 0.7–4.0)
MCH: 33.9 pg (ref 26.0–34.0)
MCHC: 33.8 g/dL (ref 30.0–36.0)
MCV: 100.3 fL — ABNORMAL HIGH (ref 80.0–100.0)
Monocytes Absolute: 0.6 10*3/uL (ref 0.1–1.0)
Monocytes Relative: 11 %
Neutro Abs: 3.2 10*3/uL (ref 1.7–7.7)
Neutrophils Relative %: 59 %
Platelets: 199 10*3/uL (ref 150–400)
RBC: 3.39 MIL/uL — ABNORMAL LOW (ref 3.87–5.11)
RDW: 11.7 % (ref 11.5–15.5)
WBC: 5.5 10*3/uL (ref 4.0–10.5)
nRBC: 0 % (ref 0.0–0.2)

## 2021-11-04 LAB — HEMOGLOBIN A1C
Hgb A1c MFr Bld: 5.2 % (ref 4.8–5.6)
Mean Plasma Glucose: 102.54 mg/dL

## 2021-11-04 LAB — MAGNESIUM: Magnesium: 2.1 mg/dL (ref 1.7–2.4)

## 2021-11-04 MED ORDER — ATORVASTATIN CALCIUM 10 MG PO TABS: 10.0000 mg | ORAL_TABLET | Freq: Every day | ORAL | Status: DC

## 2021-11-04 MED ORDER — ACETAMINOPHEN 160 MG/5ML PO SOLN: 650.0000 mg | ORAL | Status: DC | PRN

## 2021-11-04 MED ORDER — STROKE: EARLY STAGES OF RECOVERY BOOK: Freq: Once | Status: DC

## 2021-11-04 MED ORDER — ROSUVASTATIN CALCIUM 5 MG PO TABS
10.0000 mg | ORAL_TABLET | Freq: Every day | ORAL | Status: DC
  Administered 2021-11-04 – 2021-11-05 (×2): 10 mg via ORAL
  Filled 2021-11-04 (×2): qty 2

## 2021-11-04 MED ORDER — STROKE: EARLY STAGES OF RECOVERY BOOK
Freq: Once | Status: AC
  Filled 2021-11-04: qty 1

## 2021-11-04 MED ORDER — ASPIRIN EC 81 MG PO TBEC
81.0000 mg | DELAYED_RELEASE_TABLET | Freq: Every day | ORAL | Status: DC
Start: 2021-11-04 — End: 2021-11-05
  Administered 2021-11-04 – 2021-11-05 (×2): 81 mg via ORAL
  Filled 2021-11-04 (×2): qty 1

## 2021-11-04 MED ORDER — ACETAMINOPHEN 325 MG PO TABS: 650.0000 mg | ORAL_TABLET | ORAL | Status: DC | PRN

## 2021-11-04 MED ORDER — ACETAMINOPHEN 650 MG RE SUPP: 650.0000 mg | RECTAL | Status: DC | PRN

## 2021-11-04 MED ORDER — HYDRALAZINE HCL 20 MG/ML IJ SOLN: 5.0000 mg | Freq: Four times a day (QID) | INTRAMUSCULAR | Status: DC | PRN

## 2021-11-04 MED ORDER — CLOPIDOGREL BISULFATE 75 MG PO TABS
75.0000 mg | ORAL_TABLET | Freq: Every day | ORAL | Status: DC
  Administered 2021-11-04 – 2021-11-05 (×2): 75 mg via ORAL
  Filled 2021-11-04 (×2): qty 1

## 2021-11-04 MED ORDER — ENOXAPARIN SODIUM 30 MG/0.3ML IJ SOSY
30.0000 mg | PREFILLED_SYRINGE | INTRAMUSCULAR | Status: DC
  Administered 2021-11-04 – 2021-11-05 (×2): 30 mg via SUBCUTANEOUS
  Filled 2021-11-04 (×2): qty 0.3

## 2021-11-04 MED ORDER — POTASSIUM CHLORIDE CRYS ER 20 MEQ PO TBCR
40.0000 meq | EXTENDED_RELEASE_TABLET | Freq: Once | ORAL | Status: AC
  Administered 2021-11-04: 40 meq via ORAL
  Filled 2021-11-04: qty 2

## 2021-11-04 MED ORDER — ATENOLOL 25 MG PO TABS
50.0000 mg | ORAL_TABLET | Freq: Every day | ORAL | Status: DC
  Administered 2021-11-04: 50 mg via ORAL
  Filled 2021-11-04: qty 2

## 2021-11-04 MED ORDER — AMLODIPINE BESYLATE 5 MG PO TABS
5.0000 mg | ORAL_TABLET | Freq: Every day | ORAL | Status: DC
Start: 2021-11-04 — End: 2021-11-05
  Administered 2021-11-04: 5 mg via ORAL
  Filled 2021-11-04: qty 1

## 2021-11-04 NOTE — Assessment & Plan Note (Addendum)
-   2D echo with EF of 35 to 40%, left ventricle with global hypokinesis, mild concentric LVH, mildly dilated left atrial size. ?-2D echo from 12/2020 with normal EF,NWMA. ?-Patient presented with a cerebellar CVA. ?-Fasting lipid panel with LDL of 127. ?-Patient started on Crestor. ?-Due to abnormal 2D echo cardiology was consulted. ?-Patient was seen in consultation by cardiology who recommended medical management for patient's cardiomyopathy. ?-Atenolol discontinued patient started on Coreg 6.25 mg twice daily, hydralazine 10 mg 3 times daily, Imdur 15 mg daily. ?-Recommended Lasix as needed. ?-Outpatient follow-up with cardiology.Marland Kitchen ?

## 2021-11-04 NOTE — Assessment & Plan Note (Signed)
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 Monitor as outpt per neuro ?

## 2021-11-04 NOTE — Progress Notes (Incomplete)
I have seen and ass ?

## 2021-11-04 NOTE — Assessment & Plan Note (Addendum)
Creat 1.4 on admission felt to be likely close to baseline.   ?-CKD 3b documented in outside notes. ?CKD 3b also on DC summary from her PE admit in June 2022 ?-Outpatient follow-up. ?

## 2021-11-04 NOTE — Progress Notes (Addendum)
PROGRESS NOTE    Amber Gonzales  FAO:[id number] DOB: [birth year] DOA: 11/03/2021 PCP: Clayborn Heron, MD    Chief Complaint  Patient presents with   Dizziness    Brief Narrative:  No notes on file    Assessment & Plan:  Principal Problem:   Cerebellar stroke (HCC) Active Problems:   HLD (hyperlipidemia)   Essential hypertension   DM2 (diabetes mellitus, type 2) (HCC)   CKD (chronic kidney disease) stage 3, GFR 30-59 ml/min (HCC)   History of pulmonary embolism   Middle cerebral artery aneurysm   Abnormal echocardiogram   Dizziness    Assessment and Plan: * Cerebellar stroke [facility]) Patient had presented with left lower extremity weakness and vertiginous symptoms. -Patient admitted for stroke work-up. Neuro consulted CTA head and neck: See above Looks like she has some significant arteriosclerotic dz. Also a 3mm questionable aneurysm 2d echo with EF of 35 to 40%, left ventricular global hypokinesis, mild concentric LVH.  No source of emboli noted. MRI brain done with large inferior left cerebellar acute infarct.  Associated edema with partial effacement of the inferior left fourth ventricle and left forearm and upper lip scar.  No hydronephrosis noted. Fasting lipid panel with LDL of 127\ Hemoglobin A1c 5.2. Patient seen in consultation by neurology and lower extremity Dopplers ordered which are currently pending. Patient started on aspirin and Plavix for DAPT x3 weeks, then Plavix alone.. Patient started on Crestor 10 mg daily. Monitor on telemetry. Will need 30-day event monitor on discharge. Tele monitor PT/OT/SLP Neurology following and appreciate input and recommendations.  Essential hypertension Home BP meds held on admission to allow permissive hypertension. Patient started on Norvasc 5 mg daily as well as home regimen atenolol.  Hydralazine as needed.  HLD (hyperlipidemia) Lipid panel with LDL of 127. Given new stroke, evidence of atherosclerosis of  the cerebral and cerebellar arteries patient started on statin Crestor 10 mg daily which we will continue. Outpatient follow-up.  Middle cerebral artery aneurysm 3mm possible aneurysm at bifurcation of MCA Monitor as outpt per neuro  History of pulmonary embolism History of PE in June 2022. Was on eliquis after the PE but sounds like they took her off of eliquis at some point.  CKD (chronic kidney disease) stage 3, GFR 30-59 ml/min (HCC) Creat 1.4 on admission felt to be likely close to baseline.   -CKD 3 documented in outside notes. CKD 3 also on DC summary from her PE admit in June 2022  DM2 (diabetes mellitus, type 2) (HCC) On patients chart, but not on any meds to control this, and her A1C was 5.6 just last month. -Repeat hemoglobin A1c of 5.2. -Follow.  Abnormal echocardiogram - 2D echo with EF of 35 to 40%, left ventricle with global hypokinesis, mild concentric LVH, mildly dilated left atrial size. -2D echo from 12/2020 with normal EF,NWMA. -Patient presented with a cerebellar CVA. -Fasting lipid panel with LDL of 127. -Patient started on Crestor. -Continue home regimen beta-blocker. -Consult with cardiology for further evaluation and management.         DVT prophylaxis: Lovenox Code Status: Full Family Communication: Updated patient and children at bedside. Disposition: TBD  Status is: Inpatient The patient will require care spanning > 2 midnights and should be moved to inpatient because: Severity of illness.   Consultants:  Neurology: Dr.Khaliqdina 11/04/2021  Procedures:  CT angiogram head and neck 11/03/2021 MRI brain 11/04/2021 2D echo 11/04/2021 Lower extremity Dopplers 11/04/2021     Antimicrobials:  None  Subjective: Patient laying in bed.  Denies denies any chest pain.  No shortness of breath.  No abdominal pain.  Feels some improvement in left lower extremity strength.  Has not gotten up to ambulate to see whether she still having dizzy  spells.  Objective: Vitals:   11/04/21 0239 11/04/21 0858 11/04/21 1156 11/04/21 1617  BP: (!) 191/77 (!) 163/54 (!) 158/67 (!) 141/59  Pulse: 63 (!) 107 64 67  Resp: 19 18 18 16   Temp: (!) 97.4 F (36.3 C) 98.2 F (36.8 C) 98.4 F (36.9 C) 98 F (36.7 C)  TempSrc: Oral Oral Oral Oral  SpO2: 100% 100% 100% 100%  Weight:      Height:       No intake or output data in the 24 hours ending 11/04/21 1734 Filed Weights   11/03/21 1554  Weight: 53.1 kg    Examination:  General exam: Appears calm and comfortable  Respiratory system: Clear to auscultation. Respiratory effort normal. Cardiovascular system: S1 & S2 heard, RRR. No JVD, murmurs, rubs, gallops or clicks. No pedal edema. Gastrointestinal system: Abdomen is nondistended, soft and nontender. No organomegaly or masses felt. Normal bowel sounds heard. Central nervous system: Alert and oriented.  Cranial nerves II through XII grossly intact.  5/5 bilateral upper extremity strength.  5/5 right lower extremity strength, 4/5 left lower extremity strength.  Sensation intact.  Gait not tested secondary to safety. Extremities: 5/5 bilateral upper extremity strength.  5/5 right lower extremity strength.  4/5 left lower extremity strength. Skin: No rashes, lesions or ulcers Psychiatry: Judgement and insight appear normal. Mood & affect appropriate.     Data Reviewed:   CBC: Recent Labs  Lab 11/03/21 1625 11/04/21 0449  WBC 4.9 5.5  NEUTROABS  --  3.2  HGB 11.2* 11.5*  HCT 34.3* 34.0*  MCV 100.9* 100.3*  PLT 206 199    Basic Metabolic Panel: Recent Labs  Lab 11/03/21 1625 11/04/21 0449  NA 141 141  K 3.2* 3.3*  CL 104 104  CO2 28 28  GLUCOSE 111* 86  BUN 17 13  CREATININE 1.35* 1.28*  CALCIUM 9.1 8.8*  MG  --  2.1    GFR: Estimated Creatinine Clearance: 23.6 mL/min (A) (by C-G formula based on SCr of 1.28 mg/dL (H)).  Liver Function Tests: No results for input(s): AST, ALT, ALKPHOS, BILITOT, PROT, ALBUMIN  in the last 168 hours.  CBG: Recent Labs  Lab 11/03/21 1601  GLUCAP 106*     No results found for this or any previous visit (from the past 240 hour(s)).       Radiology Studies: CT ANGIO HEAD NECK W WO CM  Result Date: 11/03/2021 CLINICAL DATA:  Dizziness EXAM: CT ANGIOGRAPHY HEAD AND NECK TECHNIQUE: Multidetector CT imaging of the head and neck was performed using the standard protocol during bolus administration of intravenous contrast. Multiplanar CT image reconstructions and MIPs were obtained to evaluate the vascular anatomy. Carotid stenosis measurements (when applicable) are obtained utilizing NASCET criteria, using the distal internal carotid diameter as the denominator. RADIATION DOSE REDUCTION: This exam was performed according to the departmental dose-optimization program which includes automated exposure control, adjustment of the mA and/or kV according to patient size and/or use of iterative reconstruction technique. CONTRAST:  75mL OMNIPAQUE IOHEXOL 350 MG/ML SOLN COMPARISON:  CT head 12/10/2020 FINDINGS: CT HEAD FINDINGS Brain: Hypodensity and loss of the sulci in the left cerebellum, predominantly posteriorly and inferiorly, as well as the left inferior vermis, which is new from  the prior exam. No other new hypodensity. No acute hemorrhage, mass, mass effect, or midline shift. No hydrocephalus. The fourth ventricle remains patent. Periventricular white matter changes, likely the sequela of chronic small vessel ischemic disease. Vascular: No hyperdense vessel. Atherosclerotic calcifications in the intracranial carotid and vertebral arteries. Skull: Hyperostosis frontalis.  No acute fracture or focal lesion. Sinuses: Imaged portions are clear. Orbits: Status post right lens replacement. Review of the MIP images confirms the above findings CTA NECK FINDINGS Aortic arch: Standard branching. Imaged portion shows no evidence of aneurysm or dissection. No significant stenosis of the  major arch vessel origins. Right carotid system: No evidence of dissection, occlusion, or hemodynamically significant stenosis (greater than 50%). Left carotid system: Proximally 50% stenosis of the proximal left ICA. No dissection or occlusion. Vertebral arteries: Severe stenosis at the origin of the left vertebral artery and in the proximal V4 segment. The remainder of the left vertebral artery is patent skull base. The right vertebral artery is patent from its origin to the skull base. No dissection or occlusion. Skeleton: No acute osseous abnormality. Other neck: Subcentimeter nodules in the thyroid, for which no follow-up is indicated. (Reference: J Am Coll Radiol. 2015 Feb;12(2): 143-50) Upper chest: No focal pulmonary opacity or pleural effusion. Review of the MIP images confirms the above findings CTA HEAD FINDINGS Anterior circulation: Both internal carotid arteries are patent to the termini, with circumferential calcifications in the bilateral cavernous and proximal supraclinoid segments, which cause moderate stenosis bilaterally. A1 segments patent. Normal anterior communicating artery. Short segment severe narrowing in the mid right A2 (series 11, image 87) and moderate narrowing in the more distal right ACA (series 11, image 50). Anterior cerebral arteries are patent to their distal aspects. Moderate to severe narrowing in the distal right M1 (series 11, image 96), with focal dilatation at the MCA bifurcation measuring up to 3 mm (series 11, image 97 and series 13, image 135), most likely an MCA bifurcation aneurysm. No right M1 stenosis. Distal MCA branches are perfused but irregular. Posterior circulation: Vertebral arteries patent to the vertebrobasilar junction, with mild narrowing of the right V4 and moderate narrowing in the left V4, both proximal to the takeoff of the picas. Severe stenosis at the origin of the left PICA (series 11, image 125) and in the proximal left PICA (series 11, image 130).  The left PICA is otherwise patent. Similar severe stenosis at the origin of the right PICA (series 11, image 132 and in the proximal vessel (series 11, image 131 Basilar patent to its distal aspect, with multifocal irregularity. The right superior cerebellar artery is patent proximally. The left superior cerebellar artery is poorly perfused and likely severely stenosed at its origin (series 11, image 100). Patent P1 segments. Moderate narrowing in the proximal left P2 segment (series 11, image 98), severe narrowing in the more distal left P2 (series 11, image 99), with poor visualization of the left P3 branches, although visualization is limited due to venous contamination. The right PCA is irregular but patent. The right posterior communicating artery is not visualized. The left posterior communicating artery is visualized proximally but is occluded (series 11, image 101). Venous sinuses: As permitted by contrast timing, patent. Anatomic variants: None significant. Review of the MIP images confirms the above findings IMPRESSION: 1. Hypodensity in the left cerebellum, concerning for acute infarct, involving primarily the posterior and inferior left cerebellum, which is primarily left PICA territory, with multifocal severe narrowing of the left PICA on CTA. 2. No intracranial  large vessel occlusion. The left posterior communicating artery is patent proximally, but occluded just distal to its origin. In addition to the above-mentioned left PICA, there is severe stenosis at the origin of the left SCA, moderate stenosis in the bilateral cavernous and supraclinoid ICA, severe narrowing in the mid right A2, moderate narrowing in the more distal right ACA moderate to severe narrowing in the distal right M1, moderate narrowing of the left V4, and multifocal moderate and severe narrowing in the left P2 segment, with poor perfusion of the left P3 branches. 3. 3 mm outpouching at the right MCA bifurcation, concerning for small  aneurysm. 4. 50% stenosis in the proximal left ICA. 5. Severe stenosis at the origin of the left vertebral artery and proximal left V1 segment. These results were called by telephone at the time of interpretation on 11/03/2021 at 7:25 pm to provider Christena Deem , who verbally acknowledged these results. Electronically Signed   By: Wiliam Ke M.D.   On: 11/03/2021 19:25   MR BRAIN WO CONTRAST  Result Date: 11/04/2021 CLINICAL DATA:  Neuro deficit, acute, stroke suspected EXAM: MRI HEAD WITHOUT CONTRAST TECHNIQUE: Multiplanar, multiecho pulse sequences of the brain and surrounding structures were obtained without intravenous contrast. COMPARISON:  CT head November 03, 2021.  MRI head December 10, 2020. FINDINGS: Brain: Large inferior left cerebellar acute infarct. Associated edema with partial effacement the inferior left fourth ventricle and left foramen of Luschka. No hydrocephalus at this time. Moderate patchy additional T2/FLAIR hyperintensities in the white matter, nonspecific but compatible with chronic microvascular ischemic disease. Cerebral atrophy. No evidence of acute hemorrhage, mass lesion, or extra-axial fluid collection. Vascular: Better evaluated on recent CTA. Skull and upper cervical spine: Normal marrow signal. Sinuses/Orbits: Mild paranasal sinus mucosal thickening. No acute orbital findings. Other: No mastoid effusions. IMPRESSION: Large inferior left cerebellar acute infarct. Associated edema with partial effacement the inferior left fourth ventricle and left foramen of Luschka. No hydrocephalus at this time. Electronically Signed   By: Feliberto Harts M.D.   On: 11/04/2021 07:51   ECHOCARDIOGRAM COMPLETE  Result Date: 11/04/2021    ECHOCARDIOGRAM REPORT   Patient Name:   Amber Gonzales Date of Exam: 11/04/2021 Medical Rec #:  [id number]    Height:       62.0 in Accession #:    [id number]   Weight:       117.0 lb Date of Birth:  1929-10-16    BSA:          1.522 m Patient Age:    89 years     BP:            191/77 mmHg Patient Gender: F            HR:           65 bpm. Exam Location:  Inpatient Procedure: 2D Echo, Color Doppler and Cardiac Doppler Indications:    Stroke  History:        Patient has prior history of Echocardiogram examinations, most                 recent 12/24/2020. Risk Factors:Diabetes and Hypertension.  Sonographer:    Eduard Roux Referring Phys: [phone] JARED M GARDNER IMPRESSIONS  1. Left ventricular ejection fraction, by estimation, is 35 to 40%. The left ventricle has moderately decreased function. The left ventricle demonstrates global hypokinesis. There is mild concentric left ventricular hypertrophy. Left ventricular diastolic parameters are indeterminate.  2. Right ventricular systolic function is normal. The  right ventricular size is normal. There is normal pulmonary artery systolic pressure.  3. Left atrial size was mildly dilated.  4. A small pericardial effusion is present.  5. The mitral valve is normal in structure. Trivial mitral valve regurgitation. No evidence of mitral stenosis.  6. The aortic valve is tricuspid. There is mild calcification of the aortic valve. There is mild thickening of the aortic valve. Aortic valve regurgitation is not visualized. Aortic valve sclerosis is present, with no evidence of aortic valve stenosis.  7. The inferior vena cava is normal in size with <50% respiratory variability, suggesting right atrial pressure of 8 mmHg. Comparison(s): Changes from prior study are noted. Conclusion(s)/Recommendation(s): No intracardiac source of embolism detected on this transthoracic study. Consider a transesophageal echocardiogram to exclude cardiac source of embolism if clinically indicated. EF with global reduction, now 35-40%. FINDINGS  Left Ventricle: Left ventricular ejection fraction, by estimation, is 35 to 40%. The left ventricle has moderately decreased function. The left ventricle demonstrates global hypokinesis. The left ventricular internal  cavity size was normal in size. There is mild concentric left ventricular hypertrophy. Left ventricular diastolic parameters are indeterminate. Right Ventricle: The right ventricular size is normal. Right vetricular wall thickness was not well visualized. Right ventricular systolic function is normal. There is normal pulmonary artery systolic pressure. The tricuspid regurgitant velocity is 2.32 m/s, and with an assumed right atrial pressure of 8 mmHg, the estimated right ventricular systolic pressure is 29.5 mmHg. Left Atrium: Left atrial size was mildly dilated. Right Atrium: Right atrial size was normal in size. Pericardium: A small pericardial effusion is present. Mitral Valve: The mitral valve is normal in structure. Trivial mitral valve regurgitation. No evidence of mitral valve stenosis. Tricuspid Valve: The tricuspid valve is normal in structure. Tricuspid valve regurgitation is mild . No evidence of tricuspid stenosis. Aortic Valve: The aortic valve is tricuspid. There is mild calcification of the aortic valve. There is mild thickening of the aortic valve. Aortic valve regurgitation is not visualized. Aortic valve sclerosis is present, with no evidence of aortic valve stenosis. Aortic valve peak gradient measures 4.8 mmHg. Pulmonic Valve: The pulmonic valve was not well visualized. Pulmonic valve regurgitation is not visualized. No evidence of pulmonic stenosis. Aorta: The aortic root, ascending aorta, aortic arch and descending aorta are all structurally normal, with no evidence of dilitation or obstruction. Venous: The inferior vena cava is normal in size with less than 50% respiratory variability, suggesting right atrial pressure of 8 mmHg. IAS/Shunts: The interatrial septum was not well visualized.  LEFT VENTRICLE PLAX 2D LVIDd:         4.00 cm      Diastology LVIDs:         3.20 cm      LV e' lateral:   6.00 cm/s LV PW:         1.30 cm      LV E/e' lateral: 7.4 LV IVS:        1.30 cm LVOT diam:     1.90  cm LV SV:         48 LV SV Index:   31 LVOT Area:     2.84 cm  LV Volumes (MOD) LV vol d, MOD A4C: 108.0 ml LV vol s, MOD A4C: 75.4 ml LV SV MOD A4C:     108.0 ml RIGHT VENTRICLE            IVC RV Basal diam:  2.80 cm    IVC diam: 1.10 cm  RV S prime:     7.13 cm/s TAPSE (M-mode): 1.6 cm LEFT ATRIUM             Index        RIGHT ATRIUM           Index LA diam:        3.20 cm 2.10 cm/m   RA Area:     11.60 cm LA Vol (A2C):   57.2 ml 37.58 ml/m  RA Volume:   24.60 ml  16.16 ml/m LA Vol (A4C):   46.9 ml 30.81 ml/m LA Biplane Vol: 52.7 ml 34.62 ml/m  AORTIC VALVE                 PULMONIC VALVE AV Area (Vmax): 2.34 cm     PV Vmax:       0.91 m/s AV Vmax:        110.00 cm/s  PV Peak grad:  3.3 mmHg AV Peak Grad:   4.8 mmHg LVOT Vmax:      90.90 cm/s LVOT Vmean:     51.600 cm/s LVOT VTI:       0.168 m  AORTA Ao Root diam: 3.30 cm Ao Asc diam:  3.30 cm MITRAL VALVE               TRICUSPID VALVE MV Area (PHT): 2.99 cm    TR Peak grad:   21.5 mmHg MV Decel Time: 254 msec    TR Vmax:        232.00 cm/s MV E velocity: 44.10 cm/s MV A velocity: 92.20 cm/s  SHUNTS MV E/A ratio:  0.48        Systemic VTI:  0.17 m                            Systemic Diam: 1.90 cm Jodelle Red MD Electronically signed by Jodelle Red MD Signature Date/Time: 11/04/2021/12:07:45 PM    Final    VAS [place] LOWER EXTREMITY VENOUS (DVT)  Result Date: 11/04/2021  Lower Venous DVT Study Patient Name:  Amber Gonzales  Date of Exam:   11/04/2021 Medical Rec #: [id number]     Accession #:    [id number] Date of Birth: August 04, 1929     Patient Gender: F Patient Age:   39 years Exam Location:  [facility] Procedure:      VAS [place] LOWER EXTREMITY VENOUS (DVT) Referring Phys: Scheryl Marten XU --------------------------------------------------------------------------------  Indications: Stroke.  Comparison Study: Previous exam on 12/23/20 was positive for DVT in RLE PTV Performing Technologist: Ernestene Mention RVT, RDMS  Examination Guidelines: A complete  evaluation includes B-mode imaging, spectral Doppler, color Doppler, and power Doppler as needed of all accessible portions of each vessel. Bilateral testing is considered an integral part of a complete examination. Limited examinations for reoccurring indications may be performed as noted. The reflux portion of the exam is performed with the patient in reverse Trendelenburg.  +---------+---------------+---------+-----------+----------+--------------+ RIGHT    CompressibilityPhasicitySpontaneityPropertiesThrombus Aging +---------+---------------+---------+-----------+----------+--------------+ CFV      Full           Yes      Yes                                 +---------+---------------+---------+-----------+----------+--------------+ SFJ      Full                                                        +---------+---------------+---------+-----------+----------+--------------+  FV Prox  Full           Yes      Yes                                 +---------+---------------+---------+-----------+----------+--------------+ FV Mid   Full           Yes      Yes                                 +---------+---------------+---------+-----------+----------+--------------+ FV DistalFull           Yes      Yes                                 +---------+---------------+---------+-----------+----------+--------------+ PFV      Full                                                        +---------+---------------+---------+-----------+----------+--------------+ POP      Full           Yes      Yes                                 +---------+---------------+---------+-----------+----------+--------------+ PTV      Full                                                        +---------+---------------+---------+-----------+----------+--------------+ PERO     Full                                                         +---------+---------------+---------+-----------+----------+--------------+   +---------+---------------+---------+-----------+----------+--------------+ LEFT     CompressibilityPhasicitySpontaneityPropertiesThrombus Aging +---------+---------------+---------+-----------+----------+--------------+ CFV      Full           Yes      Yes                                 +---------+---------------+---------+-----------+----------+--------------+ SFJ      Full                                                        +---------+---------------+---------+-----------+----------+--------------+ FV Prox  Full           Yes      Yes                                 +---------+---------------+---------+-----------+----------+--------------+ FV Mid   Full  Yes      Yes                                 +---------+---------------+---------+-----------+----------+--------------+ FV DistalFull           Yes      Yes                                 +---------+---------------+---------+-----------+----------+--------------+ PFV      Full                                                        +---------+---------------+---------+-----------+----------+--------------+ POP      Full           Yes      Yes                                 +---------+---------------+---------+-----------+----------+--------------+ PTV      Full                                                        +---------+---------------+---------+-----------+----------+--------------+ PERO     Full                                                        +---------+---------------+---------+-----------+----------+--------------+    Summary: BILATERAL: - No evidence of deep vein thrombosis seen in the lower extremities, bilaterally. - No evidence of superficial venous thrombosis in the lower extremities, bilaterally. -No evidence of popliteal cyst, bilaterally.   *See table(s) above for measurements  and observations.    Preliminary         Scheduled Meds:  amLODipine  5 mg Oral Daily   aspirin EC  81 mg Oral Daily   atenolol  50 mg Oral Daily   clopidogrel  75 mg Oral Daily   enoxaparin (LOVENOX) injection  30 mg Subcutaneous Q24H   rosuvastatin  10 mg Oral Daily   Continuous Infusions:   LOS: 0 days    Time spent: 40 minutes    Ramiro Harvest, MD [facility]   To contact the attending provider between 7A-7P or the covering provider during after hours 7P-7A, please log into the web site [email or web address] and access using universal [place] password for that web site. If you do not have the password, please call the hospital operator.  11/04/2021, 5:34 PM

## 2021-11-04 NOTE — Consult Note (Signed)
?Cardiology Consultation:  ?Patient ID: Amber Gonzales ?MRN: [id number]; DOB: 05/03/1930 ? ?Admit date: 11/03/2021 ?Date of Consult: 11/04/2021 ? ?Primary Care Provider: Aretta Nip, MD ?Primary Cardiologist: None  ?Primary Electrophysiologist:  None  ? ?Patient Profile:  ?Amber Gonzales is a 88 y.o. female with a hx of CKD, HTN, HLD, DM who is being seen today for the evaluation of systolic CHF at the request of Irine Seal, MD. ? ?History of Present Illness:  ?Amber Gonzales was admitted to the hospital on 11/03/2021 with acute stroke.  She reports that she felt dizzy prior to admission.  She was then brought to the emergency room and found to have an acute infarct in the left cerebellar region.  She was also found to have multifocal severe narrowing of the left PICA.  There was no evidence of large vessel occlusion.  She was also noted to have severe intracranial atherosclerosis as detailed in the CTA report.  Cardiology was consulted due to echocardiogram demonstrating EF 35-40%.  She does have evidence of septal movement consistent with left bundle branch block.  No evidence of regional wall motion abnormality.  No cardiac source of embolism was detected.  She has a long history of hypertension.  She also has CKD stage III as well as hyperlipidemia and diet-controlled diabetes.  She does not smoke.  She reports no symptoms of chest pain or trouble breathing.  She is no evidence of congestive heart failure.  She reports no lower extremity edema.  Overall her examination was quite unremarkable.  EKG demonstrates sinus rhythm.  No evidence of A-fib or arrhythmia on telemetry. ? ?Heart Pathway Score:   ?   ? ?Past Medical History: ?Past Medical History:  ?Diagnosis Date  ? Diabetes mellitus without complication ([place])   ? Hypertension   ? Renal disorder   ? ? ?Past Surgical History: ?History reviewed. No pertinent surgical history.  ? ?Home Medications:  ?Prior to Admission medications   ?Medication Sig Start Date End  Date Taking? Authorizing Provider  ?acetaminophen (TYLENOL) 500 MG tablet Take 1,000 mg by mouth every 6 (six) hours as needed for mild pain, fever or headache.   Yes [provider]  ?amLODipine (NORVASC) 5 MG tablet Take 5 mg by mouth daily. 09/18/21  Yes [provider]  ?atenolol (TENORMIN) 50 MG tablet Take 50 mg by mouth daily.   Yes [provider]  ?Cholecalciferol (VITAMIN D3 PO) Take 1 capsule by mouth daily.   Yes [provider]  ?hydrALAZINE (APRESOLINE) 50 MG tablet Take 50 mg by mouth 2 (two) times daily. 08/10/21  Yes [provider]  ?APIXABAN Arne Cleveland) VTE STARTER PACK (10MG  AND 5MG ) Take as directed on package: start with two-5mg  tablets twice daily for 7 days. On day 8, switch to one-5mg  tablet twice daily. ?Patient not taking: Reported on 11/04/2021 12/24/20   Donne Hazel, MD  ? ? ?Inpatient Medications: ?Scheduled Meds: ? amLODipine  5 mg Oral Daily  ? aspirin EC  81 mg Oral Daily  ? atenolol  50 mg Oral Daily  ? clopidogrel  75 mg Oral Daily  ? enoxaparin (LOVENOX) injection  30 mg Subcutaneous Q24H  ? rosuvastatin  10 mg Oral Daily  ? ?Continuous Infusions: ? ?PRN Meds: ?acetaminophen **OR** acetaminophen (TYLENOL) oral liquid 160 mg/5 mL **OR** acetaminophen, hydrALAZINE ? ?Allergies:    ?No Known Allergies ? ?Social History:   ?Social History  ? ?Socioeconomic History  ? Marital status: Married  ?  Spouse name:  Not on file  ? Number of children: Not on file  ? Years of education: Not on file  ? Highest education level: Not on file  ?Occupational History  ? Not on file  ?Tobacco Use  ? Smoking status: Never  ? Smokeless tobacco: Never  ?Substance and Sexual Activity  ? Alcohol use: Never  ? Drug use: Not on file  ? Sexual activity: Not on file  ?Other Topics Concern  ? Not on file  ?Social History Narrative  ? Not on file  ? ?Social Determinants of Health  ? ?Financial Resource Strain: Not on file  ?Food Insecurity: Not on file  ?Transportation  Needs: Not on file  ?Physical Activity: Not on file  ?Stress: Not on file  ?Social Connections: Not on file  ?Intimate Partner Violence: Not on file  ?  ? ?Family History:   ?HTN - mother ? ?ROS:  ?All other ROS reviewed and negative. Pertinent positives noted in the HPI.    ? ?Physical Exam/Data:  ? ?Vitals:  ? 11/04/21 0239 11/04/21 0858 11/04/21 1156 11/04/21 1617  ?BP: (!) 191/77 (!) 163/54 (!) 158/67 (!) 141/59  ?Pulse: 63 (!) 107 64 67  ?Resp: 19 18 18 16   ?Temp: (!) 97.4 ?F (36.3 ?C) 98.2 ?F (36.8 ?C) 98.4 ?F (36.9 ?C) 98 ?F (36.7 ?C)  ?TempSrc: Oral Oral Oral Oral  ?SpO2: 100% 100% 100% 100%  ?Weight:      ?Height:      ? No intake or output data in the 24 hours ending 11/04/21 1712  ? ?  11/03/2021  ?  3:54 PM 12/22/2020  ?  5:13 PM 10/29/2020  ? 12:20 PM  ?Last 3 Weights  ?Weight (lbs) 117 lb 120 lb 1.6 oz 133 lb  ?Weight (kg) 53.071 kg 54.477 kg 60.328 kg  ?  Body mass index is 21.4 kg/m?.  ?General: Well nourished, well developed, in no acute distress ?Head: Atraumatic, normal size  ?Eyes: PEERLA, EOMI  ?Neck: Supple, no JVD ?Endocrine: No thryomegaly ?Cardiac: Normal S1, S2; RRR; no murmurs, rubs, or gallops ?Lungs: Clear to auscultation bilaterally, no wheezing, rhonchi or rales  ?Abd: Soft, nontender, no hepatomegaly  ?Ext: No edema, pulses 2+ ?Musculoskeletal: No deformities, BUE and BLE strength normal and equal ?Skin: Warm and dry, no rashes   ?Neuro: Alert and oriented to person, place, time, and situation, CNII-XII grossly intact, no focal deficits  ?Psych: Normal mood and affect  ? ?EKG:  The EKG was personally reviewed and demonstrates:  SR, LBBB ?Telemetry:  Telemetry was personally reviewed and demonstrates:  SR 70s ? ?Relevant CV Studies: ?TTE 11/04/2021 ? 1. Left ventricular ejection fraction, by estimation, is 35 to 40%. The  ?left ventricle has moderately decreased function. The left ventricle  ?demonstrates global hypokinesis. There is mild concentric left ventricular  ?hypertrophy. Left  ventricular  ?diastolic parameters are indeterminate.  ? 2. Right ventricular systolic function is normal. The right ventricular  ?size is normal. There is normal pulmonary artery systolic pressure.  ? 3. Left atrial size was mildly dilated.  ? 4. A small pericardial effusion is present.  ? 5. The mitral valve is normal in structure. Trivial mitral valve  ?regurgitation. No evidence of mitral stenosis.  ? 6. The aortic valve is tricuspid. There is mild calcification of the  ?aortic valve. There is mild thickening of the aortic valve. Aortic valve  ?regurgitation is not visualized. Aortic valve sclerosis is present, with  ?no evidence of aortic valve stenosis.  ?  7. The inferior vena cava is normal in size with <50% respiratory  ?variability, suggesting right atrial pressure of 8 mmHg.  ? ?Laboratory Data: ?High Sensitivity Troponin:   ?Recent Labs  ?Lab 11/03/21 ?1625 11/03/21 ?1835  ?TROPONINIHS 13 14  ?   ?Cardiac EnzymesNo results for input(s): TROPONINI in the last 168 hours. No results for input(s): TROPIPOC in the last 168 hours.  ?Chemistry ?Recent Labs  ?Lab 11/03/21 ?1625 11/04/21 ?[id number]  ?NA 141 141  ?K 3.2* 3.3*  ?CL 104 104  ?CO2 28 28  ?GLUCOSE 111* 86  ?BUN 17 13  ?CREATININE 1.35* 1.28*  ?CALCIUM 9.1 8.8*  ?GFRNONAA 38* 40*  ?ANIONGAP 9 9  ?  ?No results for input(s): PROT, ALBUMIN, AST, ALT, ALKPHOS, BILITOT in the last 168 hours. ?Hematology ?Recent Labs  ?Lab 11/03/21 ?1625 11/04/21 ?[id number]  ?WBC 4.9 5.5  ?RBC 3.40* 3.39*  ?HGB 11.2* 11.5*  ?HCT 34.3* 34.0*  ?MCV 100.9* 100.3*  ?MCH 32.9 33.9  ?MCHC 32.7 33.8  ?RDW 11.9 11.7  ?PLT 206 199  ? ?BNPNo results for input(s): BNP, PROBNP in the last 168 hours.  ?DDimer No results for input(s): DDIMER in the last 168 hours. ? ?Radiology/Studies:  ?CT ANGIO HEAD NECK W WO CM ? ?Result Date: 11/03/2021 ?CLINICAL DATA:  Dizziness EXAM: CT ANGIOGRAPHY HEAD AND NECK TECHNIQUE: Multidetector CT imaging of the head and neck was performed using the standard protocol  during bolus administration of intravenous contrast. Multiplanar CT image reconstructions and MIPs were obtained to evaluate the vascular anatomy. Carotid stenosis measurements (when applicable) are obtained

## 2021-11-04 NOTE — Assessment & Plan Note (Addendum)
History of PE in June 2022. ?Was on eliquis after the PE but sounds like they took her off of eliquis at some point. ?

## 2021-11-04 NOTE — Progress Notes (Signed)
?  Transition of Care (TOC) Screening Note ? ? ?Patient Details  ?Name: Amber Gonzales ?Date of Birth: 09-May-1930 ? ? ?Transition of Care (TOC) CM/SW Contact:    ?Kermit Balo, RN ?Phone Number: ?11/04/2021, 4:04 PM ? ? ? ?Transition of Care Department [facility]) has reviewed patient. We will continue to monitor patient advancement through interdisciplinary progression rounds. If new patient transition needs arise, please place a TOC consult. ?  ?

## 2021-11-04 NOTE — Progress Notes (Addendum)
STROKE TEAM PROGRESS NOTE  ? ?INTERVAL HISTORY ?Her 3 children are at the bedside.  Discussed her discontinuing eliquis. She states her physician at [facility] had her stop it about a month ago. She is will to resume or use DAPT therapy, which ever we think is most appropriate. She states that she is doing well today and she is looking forward to going home. She has not had any dizziness, nausea, numbness, weakness, or tingling today.  ? ?Vitals:  ? 11/03/21 2200 11/03/21 2300 11/03/21 2330 11/04/21 0239  ?BP: (!) 172/92 (!) 166/71 (!) 189/84 (!) 191/77  ?Pulse: 60 63 62 63  ?Resp: 18 18 13 19   ?Temp:    (!) 97.4 ?F (36.3 ?C)  ?TempSrc:    Oral  ?SpO2: 100% 100% 100% 100%  ?Weight:      ?Height:      ? ?CBC:  ?Recent Labs  ?Lab 11/03/21 ?1625  ?WBC 4.9  ?HGB 11.2*  ?HCT 34.3*  ?MCV 100.9*  ?PLT 206  ? ?Basic Metabolic Panel:  ?Recent Labs  ?Lab 11/03/21 ?1625  ?NA 141  ?K 3.2*  ?CL 104  ?CO2 28  ?GLUCOSE 111*  ?BUN 17  ?CREATININE 1.35*  ?CALCIUM 9.1  ? ?Lipid Panel:  ?Recent Labs  ?Lab 11/04/21 ?01/04/22  ?CHOL 192  ?TRIG 53  ?HDL 54  ?CHOLHDL 3.6  ?VLDL 11  ?LDLCALC 127*  ? ?HgbA1c: No results for input(s): HGBA1C in the last 168 hours. ?Urine Drug Screen: No results for input(s): LABOPIA, COCAINSCRNUR, LABBENZ, AMPHETMU, THCU, LABBARB in the last 168 hours.  ?Alcohol Level No results for input(s): ETH in the last 168 hours. ? ?IMAGING past 24 hours ?CT ANGIO HEAD NECK W WO CM ? ?Result Date: 11/03/2021 ?CLINICAL DATA:  Dizziness EXAM: CT ANGIOGRAPHY HEAD AND NECK TECHNIQUE: Multidetector CT imaging of the head and neck was performed using the standard protocol during bolus administration of intravenous contrast. Multiplanar CT image reconstructions and MIPs were obtained to evaluate the vascular anatomy. Carotid stenosis measurements (when applicable) are obtained utilizing NASCET criteria, using the distal internal carotid diameter as the denominator. RADIATION DOSE REDUCTION: This exam was performed according to the  departmental dose-optimization program which includes automated exposure control, adjustment of the mA and/or kV according to patient size and/or use of iterative reconstruction technique. CONTRAST:  34mL OMNIPAQUE IOHEXOL 350 MG/ML SOLN COMPARISON:  CT head 12/10/2020 FINDINGS: CT HEAD FINDINGS Brain: Hypodensity and loss of the sulci in the left cerebellum, predominantly posteriorly and inferiorly, as well as the left inferior vermis, which is new from the prior exam. No other new hypodensity. No acute hemorrhage, mass, mass effect, or midline shift. No hydrocephalus. The fourth ventricle remains patent. Periventricular white matter changes, likely the sequela of chronic small vessel ischemic disease. Vascular: No hyperdense vessel. Atherosclerotic calcifications in the intracranial carotid and vertebral arteries. Skull: Hyperostosis frontalis.  No acute fracture or focal lesion. Sinuses: Imaged portions are clear. Orbits: Status post right lens replacement. Review of the MIP images confirms the above findings CTA NECK FINDINGS Aortic arch: Standard branching. Imaged portion shows no evidence of aneurysm or dissection. No significant stenosis of the major arch vessel origins. Right carotid system: No evidence of dissection, occlusion, or hemodynamically significant stenosis (greater than 50%). Left carotid system: Proximally 50% stenosis of the proximal left ICA. No dissection or occlusion. Vertebral arteries: Severe stenosis at the origin of the left vertebral artery and in the proximal V4 segment. The remainder of the left vertebral artery is patent skull  base. The right vertebral artery is patent from its origin to the skull base. No dissection or occlusion. Skeleton: No acute osseous abnormality. Other neck: Subcentimeter nodules in the thyroid, for which no follow-up is indicated. (Reference: J Am Coll Radiol. 2015 Feb;12(2): 143-50) Upper chest: No focal pulmonary opacity or pleural effusion. Review of the  MIP images confirms the above findings CTA HEAD FINDINGS Anterior circulation: Both internal carotid arteries are patent to the termini, with circumferential calcifications in the bilateral cavernous and proximal supraclinoid segments, which cause moderate stenosis bilaterally. A1 segments patent. Normal anterior communicating artery. Short segment severe narrowing in the mid right A2 (series 11, image 87) and moderate narrowing in the more distal right ACA (series 11, image 50). Anterior cerebral arteries are patent to their distal aspects. Moderate to severe narrowing in the distal right M1 (series 11, image 96), with focal dilatation at the MCA bifurcation measuring up to 3 mm (series 11, image 97 and series 13, image 135), most likely an MCA bifurcation aneurysm. No right M1 stenosis. Distal MCA branches are perfused but irregular. Posterior circulation: Vertebral arteries patent to the vertebrobasilar junction, with mild narrowing of the right V4 and moderate narrowing in the left V4, both proximal to the takeoff of the picas. Severe stenosis at the origin of the left PICA (series 11, image 125) and in the proximal left PICA (series 11, image 130). The left PICA is otherwise patent. Similar severe stenosis at the origin of the right PICA (series 11, image 132 and in the proximal vessel (series 11, image 131 Basilar patent to its distal aspect, with multifocal irregularity. The right superior cerebellar artery is patent proximally. The left superior cerebellar artery is poorly perfused and likely severely stenosed at its origin (series 11, image 100). Patent P1 segments. Moderate narrowing in the proximal left P2 segment (series 11, image 98), severe narrowing in the more distal left P2 (series 11, image 99), with poor visualization of the left P3 branches, although visualization is limited due to venous contamination. The right PCA is irregular but patent. The right posterior communicating artery is not  visualized. The left posterior communicating artery is visualized proximally but is occluded (series 11, image 101). Venous sinuses: As permitted by contrast timing, patent. Anatomic variants: None significant. Review of the MIP images confirms the above findings IMPRESSION: 1. Hypodensity in the left cerebellum, concerning for acute infarct, involving primarily the posterior and inferior left cerebellum, which is primarily left PICA territory, with multifocal severe narrowing of the left PICA on CTA. 2. No intracranial large vessel occlusion. The left posterior communicating artery is patent proximally, but occluded just distal to its origin. In addition to the above-mentioned left PICA, there is severe stenosis at the origin of the left SCA, moderate stenosis in the bilateral cavernous and supraclinoid ICA, severe narrowing in the mid right A2, moderate narrowing in the more distal right ACA moderate to severe narrowing in the distal right M1, moderate narrowing of the left V4, and multifocal moderate and severe narrowing in the left P2 segment, with poor perfusion of the left P3 branches. 3. 3 mm outpouching at the right MCA bifurcation, concerning for small aneurysm. 4. 50% stenosis in the proximal left ICA. 5. Severe stenosis at the origin of the left vertebral artery and proximal left V1 segment. These results were called by telephone at the time of interpretation on 11/03/2021 at 7:25 pm to provider Christena Deem , who verbally acknowledged these results. Electronically Signed   By:  Wiliam KeAlison  Vasan M.D.   On: 11/03/2021 19:25   ? ?PHYSICAL EXAM ? ?Physical Exam  ?Constitutional: Appears well-developed and well-nourished.  ?Cardiovascular: Normal rate and regular rhythm.  ?Respiratory: Effort normal, non-labored breathing ? ?Neuro: ?Mental Status: ?Patient is awake, alert, oriented to person, place, month, year, and situation. ?Patient is able to give a clear and coherent history. ?No signs of aphasia or  neglect ?Cranial Nerves: ?II: Visual Fields are full. Pupils are equal, round, and reactive to light.   ?III,IV, VI: EOMI without ptosis or diploplia.  ?V: Facial sensation is symmetric to temperature ?VII: Facial movem

## 2021-11-04 NOTE — H&P (Signed)
?History and Physical  ? ? ?Patient: Amber Gonzales [id number] DOB: April 05, 1930 ?DOA: 11/03/2021 ?DOS: the patient was seen and examined on 11/04/2021 ?PCP: Aretta Nip, MD  ?Patient coming from: Home ? ?Chief Complaint:  ?Chief Complaint  ?Patient presents with  ? Dizziness  ? ?HPI: Amber Gonzales is a 88 y.o. female with medical history significant of HTN, CKD 3, HLD, some degree of dementia per PCP notes, prior PE no longer on eliquis. ? ?Prior diagnosis of DM2, but looks like just diet controlled at this point with good A1C. ? ?[facility]/o LLE weakness and vertigo.  Feels like she is drunk when walking.  Symptoms constant and persistent.  Onset 2-3 days ago per EDP, ~1 week per patient. ?  ?Review of Systems: As mentioned in the history of present illness. All other systems reviewed and are negative. ?Past Medical History:  ?Diagnosis Date  ? Diabetes mellitus without complication ([place])   ? Hypertension   ? Renal disorder   ? ?History reviewed. No pertinent surgical history. ?Social History:  reports that she has never smoked. She has never used smokeless tobacco. She reports that she does not drink alcohol. No history on file for drug use. ? ?No Known Allergies ? ?No family history on file. ? ?Prior to Admission medications   ?Medication Sig Start Date End Date Taking? Authorizing Provider  ?acetaminophen (TYLENOL) 500 MG tablet Take 1,000 mg by mouth every 6 (six) hours as needed for mild pain, fever or headache.    [provider]  ?APIXABAN Arne Cleveland) VTE STARTER PACK (10MG  AND 5MG ) Take as directed on package: start with two-5mg  tablets twice daily for 7 days. On day 8, switch to one-5mg  tablet twice daily. 12/24/20   Donne Hazel, MD  ?atenolol (TENORMIN) 50 MG tablet Take 50 mg by mouth daily.    [provider]  ?Cholecalciferol (VITAMIN D3 PO) Take 1 capsule by mouth daily.    [provider]  ?hydrALAZINE (APRESOLINE) 25 MG tablet Take 25 mg  by mouth 2 (two) times daily.    [provider]  ? ? ?Physical Exam: ?Vitals:  ? 11/03/21 2200 11/03/21 2300 11/03/21 2330 11/04/21 0239  ?BP: (!) 172/92 (!) 166/71 (!) 189/84 (!) 191/77  ?Pulse: 60 63 62 63  ?Resp: 18 18 13 19   ?Temp:    (!) 97.4 ?F (36.3 ?C)  ?TempSrc:    Oral  ?SpO2: 100% 100% 100% 100%  ?Weight:      ?Height:      ? ?Constitutional: NAD, calm, comfortable ?Eyes: PERRL, lids and conjunctivae normal ?ENMT: Mucous membranes are moist. Posterior pharynx clear of any exudate or lesions.Normal dentition.  ?Neck: normal, supple, no masses, no thyromegaly ?Respiratory: clear to auscultation bilaterally, no wheezing, no crackles. Normal respiratory effort. No accessory muscle use.  ?Cardiovascular: Regular rate and rhythm, no murmurs / rubs / gallops. No extremity edema. 2+ pedal pulses. No carotid bruits.  ?Abdomen: no tenderness, no masses palpated. No hepatosplenomegaly. Bowel sounds positive.  ?Musculoskeletal: no clubbing / cyanosis. No joint deformity upper and lower extremities. Good ROM, no contractures. Normal muscle tone.  ?Skin: no rashes, lesions, ulcers. No induration ?Neurologic: CN 2-12 grossly intact. Sensation intact, DTR normal. Strength 5/5 in all 4. Pt has truncal ataxia. ?Psychiatric: Normal judgment and insight. Alert and oriented x 3. Normal mood.  ? ?Data Reviewed: ? ?  ?CT head = L cerebellum PICA territory stroke, suspicious for acute stroke. ? ?  CTA head and neck: ? ?No intracranial large vessel occlusion. The left posterior ?communicating artery is patent proximally, but occluded just distal ?to its origin. In addition to the above-mentioned left PICA, there ?is severe stenosis at the origin of the left SCA, moderate stenosis ?in the bilateral cavernous and supraclinoid ICA, severe narrowing in ?the mid right A2, moderate narrowing in the more distal right ACA ?moderate to severe narrowing in the distal right M1, moderate ?narrowing of the left V4, and multifocal  moderate and severe ?narrowing in the left P2 segment, with poor perfusion of the left P3 ?branches. ?2. 3 mm outpouching at the right MCA bifurcation, concerning for ?small aneurysm. ?3. 50% stenosis in the proximal left ICA. ?4. Severe stenosis at the origin of the left vertebral artery and ?proximal left V1 segment. ? ? ?Assessment and Plan: ?* Cerebellar stroke ([place]) ?Stroke pathway ?Neuro consult ?CTA head and neck: ?See above ?Looks like she has some significant arteriosclerotic dz. ?Also a 50mm questionable aneurysm ?2d echo ?Tele monitor ?PT/OT ?A1C = 5.6 as of last month ?Lipid pnl pending (see HLD discussion below) ?ASA 81 ?Plavix 75 for 21 days ? ?Essential hypertension ?Hold home BP meds and allow permissive HTN. ?Treat if SBP > 220 or DBP > 120 ?Defer timing of resuming of BP meds to stroke team ? ?HLD (hyperlipidemia) ?Lipid panel pending ?Had LDL 135 and HDL 49 as of June 2022 ?Given above and given new stroke with evidence of atherosclerosis of cerebral and cerebellar arteries: going to start statin.  Likely needs to titrate up on dose. ? ?Middle cerebral artery aneurysm ?58mm possible aneurysm at bifurcation of MCA ?Monitor as outpt per neuro ? ?History of pulmonary embolism ?History of PE in June 2022. ?Was on eliquis after the PE but sounds like they took her off of eliquis at some point. ? ?CKD (chronic kidney disease) stage 3, GFR 30-59 ml/min ([facility]) ?Creat 1.4 today, probably about baseline.  CKD 3 documented in outside notes. ?CKD 3 also on DC summary from her PE admit in June 2022 ? ?DM2 (diabetes mellitus, type 2) ([place]) ?On patients chart, but not on any meds to control this, and her A1C was 5.6 just last month. ?Will check blood sugars AC/HS while shes here, at least until we are convinced that she doesn't get BGL elevations. ? ? ? ? ? Advance Care Planning:   Code Status: Full Code ? ?Consults: Neuro ? ?Family Communication: Daughter at bedside ? ?Severity of Illness: ?The appropriate patient  status for this patient is OBSERVATION. Observation status is judged to be reasonable and necessary in order to provide the required intensity of service to ensure the patient's safety. The patient's presenting symptoms, physical exam findings, and initial radiographic and laboratory data in the context of their medical condition is felt to place them at decreased risk for further clinical deterioration. Furthermore, it is anticipated that the patient will be medically stable for discharge from the hospital within 2 midnights of admission.  ? ?Author: ?Etta Quill., DO ?11/04/2021 4:24 AM ? ?For on call review [email or web address].  ?

## 2021-11-04 NOTE — Assessment & Plan Note (Addendum)
1. Home BP meds held on admission to allow permissive hypertension. ?2. Patient started on Norvasc 5 mg daily as well as home regimen atenolol.  Hydralazine as needed. ?3. Due to abnormal 2D echo patient seen in consultation by cardiology and antihypertensive regimen changed.  Norvasc and atenolol were discontinued and patient started on Coreg 6.25 mg twice daily, hydralazine 10 mg 3 times daily, Imdur 15 mg daily. ?4. Outpatient follow-up with PCP and cardiology. ?

## 2021-11-04 NOTE — Assessment & Plan Note (Addendum)
1. Lipid panel with LDL of 127. ?2. Given new stroke, evidence of atherosclerosis of the cerebral and cerebellar arteries patient started on statin Crestor 10 mg daily which we will continue. ?3. Outpatient follow-up. ?

## 2021-11-04 NOTE — Evaluation (Signed)
Occupational Therapy Evaluation ?Patient Details ?Name: Amber Gonzales ?MRN: [id number] ?DOB: 04/23/1930 ?Today's Date: 11/04/2021 ? ? ?History of Present Illness Pt is an 88 y/o F presenting to ED on 4/30 with LLE weakness and dizziness x3 days, CT imaging showing posterior/inferior acute L cerebellar infarct. PMH includes DM, HTN, and renal disorder.  ? ?Clinical Impression ?  ?Pt reports independence at baseline with ADLs and functional mobility, reports no AD use and lives with son who can assist 24/7 at d/c. Pt supervision - min guard for ADLs, mod I for bed mobility, and supervision for transfers without AD. Pt with mild difficulty with visual tracking/pursuits when tracking to R side, overshoots/ undershoots target at times. Pt presenting with impairments listed below, will follow acutely. Anticipate no OT follow up pending pt progress.  ?   ? ?Recommendations for follow up therapy are one component of a multi-disciplinary discharge planning process, led by the attending physician.  Recommendations may be updated based on patient status, additional functional criteria and insurance authorization.  ? ?Follow Up Recommendations ? No OT follow up  ?  ?Assistance Recommended at Discharge Intermittent Supervision/Assistance  ?Patient can return home with the following A little help with walking and/or transfers;A little help with bathing/dressing/bathroom;Assistance with cooking/housework;Help with stairs or ramp for entrance;Assist for transportation ? ?  ?Functional Status Assessment ? Patient has had a recent decline in their functional status and demonstrates the ability to make significant improvements in function in a reasonable and predictable amount of time.  ?Equipment Recommendations ? BSC/3in1 (as shower seat)  ?  ?Recommendations for Other Services PT consult ? ? ?  ?Precautions / Restrictions Precautions ?Precautions: Fall ?Restrictions ?Weight Bearing Restrictions: No  ? ?  ? ?Mobility Bed Mobility ?Overal  bed mobility: Modified Independent ?  ?  ?  ?  ?  ?  ?  ?  ? ?Transfers ?Overall transfer level: Needs assistance ?  ?Transfers: Sit to/from Stand ?Sit to Stand: Supervision ?  ?  ?  ?  ?  ?General transfer comment: no AD use ?  ? ?  ?Balance Overall balance assessment: Needs assistance ?Sitting-balance support: Feet supported ?Sitting balance-Leahy Scale: Good ?Sitting balance - Comments: reaches outside BOS without LOB ?  ?Standing balance support: During functional activity ?Standing balance-Leahy Scale: Good ?Standing balance comment: no external support needed ?  ?  ?  ?  ?  ?  ?  ?  ?  ?  ?  ?   ? ?ADL either performed or assessed with clinical judgement  ? ?ADL Overall ADL's : Needs assistance/impaired ?Eating/Feeding: Set up;Sitting ?  ?Grooming: Set up;Sitting ?  ?Upper Body Bathing: Supervision/ safety;Sitting ?  ?Lower Body Bathing: Supervison/ safety;Sitting/lateral leans;Sit to/from stand ?  ?Upper Body Dressing : Supervision/safety;Sitting ?  ?Lower Body Dressing: Supervision/safety;Sitting/lateral leans ?Lower Body Dressing Details (indicate cue type and reason): to don shoes ?Toilet Transfer: Min guard;Ambulation;Regular Toilet ?Toilet Transfer Details (indicate cue type and reason): simulated in room ?Toileting- Clothing Manipulation and Hygiene: Supervision/safety;Sitting/lateral lean;Sit to/from stand ?  ?  ?  ?Functional mobility during ADLs: Supervision/safety ?   ? ? ? ?Vision   ?Vision Assessment?: Vision impaired- to be further tested in functional context ?Additional Comments: reports wearing glasses, difficulty fixating/visuallly attending to target when tracking to R side  ?   ?Perception   ?  ?Praxis   ?  ? ?Pertinent Vitals/Pain Pain Assessment ?Pain Assessment: No/denies pain  ? ? ? ?Hand Dominance   ?  ?Extremity/Trunk Assessment  Upper Extremity Assessment ?Upper Extremity Assessment: Overall WFL for tasks assessed ?  ?Lower Extremity Assessment ?Lower Extremity Assessment: Defer to PT  evaluation ?  ?Cervical / Trunk Assessment ?Cervical / Trunk Assessment: Normal ?  ?Communication Communication ?Communication: No difficulties ?  ?Cognition Arousal/Alertness: Awake/alert ?Behavior During Therapy: [facility] for tasks assessed/performed ?Overall Cognitive Status: Within Functional Limits for tasks assessed ?  ?  ?  ?  ?  ?  ?  ?  ?  ?  ?  ?  ?  ?  ?  ?  ?General Comments: pt able to recall 2/3 words when given 3 word recall, recalls 3/3 words when given hint. Pt's son & daughter in room, states pt is at baseline cognition ?  ?  ?General Comments  VSS on RA, pt's children in room, assisting with PLOF questions ? ?  ?Exercises   ?  ?Shoulder Instructions    ? ? ?Home Living Family/patient expects to be discharged to:: Private residence ?Living Arrangements: Children (lives with son) ?Available Help at Discharge: Family;Available 24 hours/day ?Type of Home: House ?Home Access: Stairs to enter ?Entrance Stairs-Number of Steps: 4 ?Entrance Stairs-Rails: Right ?Home Layout: Multi-level;Able to live on main level with bedroom/bathroom;Laundry or work area in basement ?  ?  ?Bathroom Shower/Tub: Tub/shower unit;Curtain ?  ?  ?  ?  ?Home Equipment: None ?  ?  ?  ? ?  ?Prior Functioning/Environment Prior Level of Function : Independent/Modified Independent ?  ?  ?  ?  ?  ?  ?Mobility Comments: no AD use ?ADLs Comments: does IADLs, reports bathing sinkside ?  ? ?  ?  ?OT Problem List: Decreased strength;Decreased range of motion;Decreased activity tolerance;Impaired balance (sitting and/or standing);Decreased cognition ?  ?   ?OT Treatment/Interventions: Self-care/ADL training;Therapeutic exercise;DME and/or AE instruction;Therapeutic activities;Patient/family education;Balance training;Cognitive remediation/compensation  ?  ?OT Goals(Current goals can be found in the care plan section) Acute Rehab OT Goals ?Patient Stated Goal: none stated ?OT Goal Formulation: With patient/family ?Time For Goal Achievement:  11/18/21 ?Potential to Achieve Goals: Good ?ADL Goals ?Pt Will Perform Upper Body Dressing: with modified independence;sitting;standing ?Pt Will Perform Lower Body Dressing: with modified independence;sitting/lateral leans;sit to/from stand ?Pt Will Transfer to Toilet: with modified independence;ambulating;regular height toilet ?Pt Will Perform Tub/Shower Transfer: with modified independence;rolling walker;ambulating;Shower transfer;Tub transfer ?Additional ADL Goal #1: Pt will perform 2 step task with 100% accuracy in prep for ADLs.  ?OT Frequency: Min 3X/week ?  ? ?Co-evaluation   ?  ?  ?  ?  ? ?  ?AM-PAC OT "6 Clicks" Daily Activity     ?Outcome Measure Help from another person eating meals?: None ?Help from another person taking care of personal grooming?: None ?Help from another person toileting, which includes using toliet, bedpan, or urinal?: A Little ?Help from another person bathing (including washing, rinsing, drying)?: A Little ?Help from another person to put on and taking off regular upper body clothing?: A Little ?Help from another person to put on and taking off regular lower body clothing?: A Little ?6 Click Score: 20 ?  ?End of Session Equipment Utilized During Treatment: Gait belt ?Nurse Communication: Mobility status ? ?Activity Tolerance: Patient tolerated treatment well ?Patient left: in bed;with call bell/phone within reach;with bed alarm set;with family/visitor present ? ?OT Visit Diagnosis: Unsteadiness on feet (R26.81);Other abnormalities of gait and mobility (R26.89);Muscle weakness (generalized) (M62.81)  ?              ?Time: [id number] ?OT Time Calculation (min): 15  min ?Charges:  OT General Charges ?$OT Visit: 1 Visit ?OT Evaluation ?$OT Eval Low Complexity: 1 Low ? ?Alfonzo Beers, OTD, OTR/L ?Acute Rehab ?[phone] ? ?Mayer Masker ?11/04/2021, 5:01 PM ?

## 2021-11-04 NOTE — Consult Note (Addendum)
NEUROLOGY CONSULTATION NOTE  ? ?Date of service: Nov 04, 2021 ?Patient Name: Amber Gonzales ?MRN:  [id number] ?DOB:  [birth year] ?Reason for consult: "L cerbellum stroke" ?Requesting Provider: Etta Quill, DO ?_ _ _   _ __   _ __ _ _  __ __   _ __   __ _ ? ?History of Present Illness  ?Amber Gonzales is a 88 y.o. female with PMH significant for DM2, HTN, who presents with 2-3 day hx of LLE weakness and Vertigo. Felt like she is drunk when she is walking. CTH with acute L cerebellum infarct. She was on Eliquis in the past but this was for PE. Per discussion with the ED team, she is no longer on Eliquis. ? ?She reports about 1 week of feeling very wobbly when she walks. ? ?LKW: 10/27/21 ?mRS: 4 ?tNKASE: no, outside window ?Thrombectomy: no, outside window ?NIHSS components Score: Comment  ?1a Level of Conscious 0[x]  1[]  2[]  3[]      ?1b LOC Questions 0[x]  1[]  2[]       ?1c LOC Commands 0[x]  1[]  2[]       ?2 Best Gaze 0[x]  1[]  2[]       ?3 Visual 0[x]  1[]  2[]  3[]      ?4 Facial Palsy 0[x]  1[]  2[]  3[]      ?5a Motor Arm - left 0[x]  1[]  2[]  3[]  4[]  UN[]    ?5b Motor Arm - Right 0[x]  1[]  2[]  3[]  4[]  UN[]    ?6a Motor Leg - Left 0[x]  1[]  2[]  3[]  4[]  UN[]    ?6b Motor Leg - Right 0[x]  1[]  2[]  3[]  4[]  UN[]    ?7 Limb Ataxia 0[x]  1[]  2[]  3[]  UN[]     ?8 Sensory 0[x]  1[]  2[]  UN[]      ?9 Best Language 0[x]  1[]  2[]  3[]      ?10 Dysarthria 0[x]  1[]  2[]  UN[]      ?11 Extinct. and Inattention 0[x]  1[]  2[]       ?TOTAL: 0   ? ? ?  ?ROS  ? ?Constitutional Denies weight loss, fever and chills.   ?HEENT Denies changes in vision and hearing.   ?Respiratory Denies SOB and cough.   ?CV Denies palpitations and CP   ?GI Denies abdominal pain, nausea, vomiting and diarrhea.   ?GU Denies dysuria and urinary frequency.   ?MSK Denies myalgia and joint pain.   ?Skin Denies rash and pruritus.   ?Neurological Denies headache and syncope.   ?Psychiatric Denies recent changes in mood. Denies anxiety and depression.   ? ?Past History  ? ?Past Medical History:   ?Diagnosis Date  ? Diabetes mellitus without complication ([place])   ? Hypertension   ? Renal disorder   ? ?History reviewed. No pertinent surgical history. ?No family history on file. ?Social History  ? ?Socioeconomic History  ? Marital status: Married  ?  Spouse name: Not on file  ? Number of children: Not on file  ? Years of education: Not on file  ? Highest education level: Not on file  ?Occupational History  ? Not on file  ?Tobacco Use  ? Smoking status: Never  ? Smokeless tobacco: Never  ?Substance and Sexual Activity  ? Alcohol use: Never  ? Drug use: Not on file  ? Sexual activity: Not on file  ?Other Topics Concern  ? Not on file  ?Social History Narrative  ? Not on file  ? ?Social Determinants of Health  ? ?Financial Resource Strain: Not on file  ?Food Insecurity: Not on file  ?Transportation Needs: Not on file  ?  Physical Activity: Not on file  ?Stress: Not on file  ?Social Connections: Not on file  ? ?No Known Allergies ? ?Medications  ? ?Medications Prior to Admission  ?Medication Sig Dispense Refill Last Dose  ? acetaminophen (TYLENOL) 500 MG tablet Take 1,000 mg by mouth every 6 (six) hours as needed for mild pain, fever or headache.     ? APIXABAN (ELIQUIS) VTE STARTER PACK (10MG  AND 5MG ) Take as directed on package: start with two-5mg  tablets twice daily for 7 days. On day 8, switch to one-5mg  tablet twice daily. 74 each 0   ? atenolol (TENORMIN) 50 MG tablet Take 50 mg by mouth daily.     ? Cholecalciferol (VITAMIN D3 PO) Take 1 capsule by mouth daily.     ? hydrALAZINE (APRESOLINE) 25 MG tablet Take 25 mg by mouth 2 (two) times daily.     ?  ? ?Vitals  ? ?Vitals:  ? 11/03/21 2200 11/03/21 2300 11/03/21 2330 11/04/21 0239  ?BP: (!) 172/92 (!) 166/71 (!) 189/84 (!) 191/77  ?Pulse: 60 63 62 63  ?Resp: 18 18 13 19   ?Temp:    (!) 97.4 ?F (36.3 ?C)  ?TempSrc:    Oral  ?SpO2: 100% 100% 100% 100%  ?Weight:      ?Height:      ?  ? ?Body mass index is 21.4 kg/m?. ? ?Physical Exam  ? ?General: Laying  comfortably in bed; in no acute distress.  ?HENT: Normal oropharynx and mucosa. Normal external appearance of ears and nose.  ?Neck: Supple, no pain or tenderness  ?CV: No JVD. No peripheral edema.  ?Pulmonary: Symmetric Chest rise. Normal respiratory effort.  ?Abdomen: Soft to touch, non-tender.  ?Ext: No cyanosis, edema, or deformity  ?Skin: No rash. Normal palpation of skin.   ?Musculoskeletal: Normal digits and nails by inspection. No clubbing.  ? ?Neurologic Examination  ?Mental status/Cognition: Alert, oriented to self, place, month and year, good attention.  ?Speech/language: Fluent, comprehension intact, object naming intact, repetition intact.  ?Cranial nerves:  ? CN II Pupils equal and reactive to light, no VF deficits   ? CN III,IV,VI EOM intact, no gaze preference or deviation, no nystagmus   ? CN V normal sensation in V1, V2, and V3 segments bilaterally   ? CN VII no asymmetry, no nasolabial fold flattening   ? CN VIII normal hearing to speech   ? CN IX & X normal palatal elevation, no uvular deviation   ? CN XI 5/5 head turn and 5/5 shoulder shrug bilaterally   ? CN XII midline tongue protrusion   ? ?Motor:  ?Muscle bulk: normal, tone normal, pronator drift none tremor none ?Mvmt Root Nerve  Muscle Right Left Comments  ?SA C5/6 Ax Deltoid 5 5   ?EF C5/6 Mc Biceps 5 5   ?EE C6/7/8 Rad Triceps 5 5   ?WF C6/7 Med FCR     ?WE C7/8 PIN ECU     ?F Ab C8/T1 U ADM/FDI 5 5   ?HF L1/2/3 Fem Illopsoas 5 5   ?KE L2/3/4 Fem Quad 5 5   ?DF L4/5 D Peron Tib Ant 5 5   ?PF S1/2 Tibial Grc/Sol 5 5   ? ?Reflexes: ? Right Left Comments  ?Pectoralis     ? Biceps (C5/6) 1 1   ?Brachioradialis (C5/6) 1 1   ? Triceps (C6/7) 1 1   ? Patellar (L3/4) 1 1   ? Achilles (S1)     ? [place]     ?  Plantar     ?Jaw jerk   ? ?Sensation: ? Light touch Intact throughout  ? Pin prick   ? Temperature   ? Vibration   ?Proprioception   ? ?Coordination/Complex Motor:  ?- Finger to Nose intact BL ?- Heel to shin intact BL ?- Rapid alternating  movement are normal ?- Gait: Stride length short. Arm swing poor. Base width wide. Has truncal ataxia. ? ?Labs  ? ?CBC:  ?Recent Labs  ?Lab 11/03/21 ?1625  ?WBC 4.9  ?HGB 11.2*  ?HCT 34.3*  ?MCV 100.9*  ?PLT 206  ? ? ?Basic Metabolic Panel:  ?Lab Results  ?Component Value Date  ? NA 141 11/03/2021  ? K 3.2 (L) 11/03/2021  ? CO2 28 11/03/2021  ? GLUCOSE 111 (H) 11/03/2021  ? BUN 17 11/03/2021  ? CREATININE 1.35 (H) 11/03/2021  ? CALCIUM 9.1 11/03/2021  ? GFRNONAA 38 (L) 11/03/2021  ? GFRAA 37 05/07/2009  ? ?Lipid Panel:  ?Lab Results  ?Component Value Date  ? [place] 74 07/12/2010  ? ?HgbA1c:  ?Lab Results  ?Component Value Date  ? HGBA1C 6.6 06/25/2010  ? ?Urine Drug Screen: No results found for: LABOPIA, COCAINSCRNUR, [place], [place], THCU, LABBARB  ?Alcohol Level No results found for: ETH ? ?CT Head without contrast(Personally reviewed): ?L cerbellum PICA territory stroke. ? ?CT angio Head and Neck with contrast(Personally reviewed): ?No intracranial large vessel occlusion. The left posterior ?communicating artery is patent proximally, but occluded just distal ?to its origin. In addition to the above-mentioned left PICA, there ?is severe stenosis at the origin of the left SCA, moderate stenosis ?in the bilateral cavernous and supraclinoid ICA, severe narrowing in ?the mid right A2, moderate narrowing in the more distal right ACA ?moderate to severe narrowing in the distal right M1, moderate ?narrowing of the left V4, and multifocal moderate and severe ?narrowing in the left P2 segment, with poor perfusion of the left P3 ?branches. ?2. 3 mm outpouching at the right MCA bifurcation, concerning for ?small aneurysm. ?3. 50% stenosis in the proximal left ICA. ?4. Severe stenosis at the origin of the left vertebral artery and ?proximal left V1 segment. ? ?MRI Brain(Personally reviewed): ?Pending. ? ?Impression  ? ?VALOR HUEBER is a 88 y.o. female with PMH significant for with PMH significant for DM2, HTN, who presents  with 2-3 day hx of LLE weakness and Vertigo. She was found to have a L cerebellum PICA territory stroke. Her neurologic examination is notable for truncal ataxia when attempting to walk, no other focal deficit. ? ?Reco

## 2021-11-04 NOTE — Assessment & Plan Note (Addendum)
Patient had presented with left lower extremity weakness and vertiginous symptoms. ?-Patient admitted for stroke work-up. ?1. Neuro consulted ?2. CTA head and neck: ?1. See above ?2. Looks like she has some significant arteriosclerotic dz. ?3. Also a 22mm questionable aneurysm ?3. 2d echo with EF of 35 to 40%, left ventricular global hypokinesis, mild concentric LVH.  No source of emboli noted. ?4. MRI brain done with large inferior left cerebellar acute infarct.  Associated edema with partial effacement of the inferior left fourth ventricle and left forearm and upper lip scar.  No hydronephrosis noted. ?5. Fasting lipid panel with LDL of 127\ ?6. Hemoglobin A1c 5.2. ?7. Patient seen in consultation by neurology and lower extremity Dopplers ordered which are currently pending. ?8. Patient started on aspirin and Plavix for DAPT x3 months then aspirin alone given intracranial stenosis. ?9. Patient started on Crestor 10 mg daily. ?10. Patient maintained on telemetry during the hospitalization ?11. Will need 30-day event monitor on discharge ?12. Patient seen by PT/OT/SLP and no home health needs were recommended. ?13. Outpatient follow-up with neurology. ?

## 2021-11-04 NOTE — Assessment & Plan Note (Addendum)
On patients chart, but not on any meds to control this, and her A1C was 5.6 just last month. ?-Repeat hemoglobin A1c of 5.2. ?-Outpatient follow-up with PCP. ?

## 2021-11-04 NOTE — Evaluation (Signed)
Physical Therapy Evaluation ?Patient Details ?Name: Amber Gonzales ?MRN: [id number] ?DOB: Nov 29, 1929 ?Today's Date: 11/04/2021 ? ?History of Present Illness ? Pt is an 88 y/o F presenting to ED on 4/30 with LLE weakness and dizziness x3 days, CT imaging showing posterior/inferior acute L cerebellar infarct. PMH includes DM, HTN, and renal disorder.  ?Clinical Impression ? Pt admitted with/for stroke with weakness and dizziness x 3 day.  Imaging showing L cerebellar infarct.  Pt has made significant recovery needing no more than supervision for mobility and gait.Marland Kitchen  Pt currently limited functionally due to the problems listed below.  (see problems list.)  Pt will benefit from PT to maximize function and safety to be able to get home safely with available assist . ?   ?   ? ?Recommendations for follow up therapy are one component of a multi-disciplinary discharge planning process, led by the attending physician.  Recommendations may be updated based on patient status, additional functional criteria and insurance authorization. ? ?Follow Up Recommendations No PT follow up ? ?  ?Assistance Recommended at Discharge PRN  ?Patient can return home with the following ? Assist for transportation;Assistance with cooking/housework ? ?  ?Equipment Recommendations None recommended by PT  ?Recommendations for Other Services ?    ?  ?Functional Status Assessment Patient has had a recent decline in their functional status and demonstrates the ability to make significant improvements in function in a reasonable and predictable amount of time.  ? ?  ?Precautions / Restrictions Precautions ?Precautions: Fall ?Restrictions ?Weight Bearing Restrictions: No  ? ?  ? ?Mobility ? Bed Mobility ?Overal bed mobility: Modified Independent ?  ?  ?  ?  ?  ?  ?  ?  ? ?Transfers ?Overall transfer level: Needs assistance ?  ?Transfers: Sit to/from Stand ?Sit to Stand: Supervision ?  ?  ?  ?  ?  ?General transfer comment: no AD use, occasional instability  that she self corrected or leaned against the bed. ?  ? ?Ambulation/Gait ?Ambulation/Gait assistance: Supervision ?Gait Distance (Feet): 300 Feet ?Assistive device: None ?Gait Pattern/deviations: Step-through pattern ?Gait velocity: moderate speed ?Gait velocity interpretation: >2.62 ft/sec, indicative of community ambulatory ?  ?General Gait Details: generally steady, but some drift and occasional deviation with scanning. ? ?Stairs ?Stairs: Yes ?Stairs assistance: Supervision ?Stair Management: One rail Right, Alternating pattern, Forwards ?Number of Stairs: 4 ?General stair comments: safe with the rail ? ?Wheelchair Mobility ?  ? ?Modified Rankin (Stroke Patients Only) ?Modified Rankin (Stroke Patients Only) ?Pre-Morbid Rankin Score: No symptoms ?Modified Rankin: Moderate disability ? ?  ? ?Balance Overall balance assessment: Needs assistance ?Sitting-balance support: Feet supported ?Sitting balance-Leahy Scale: Good ?Sitting balance - Comments: reaches outside BOS without LOB , reached to floor, grabbed her shoes and put them on. ?  ?Standing balance support: During functional activity ?Standing balance-Leahy Scale: Good ?Standing balance comment: no external support needed ?  ?  ?  ?  ?  ?  ?  ?  ?Standardized Balance Assessment ?Standardized Balance Assessment : Sharlene Motts Balance Test ?Sharlene Motts Balance Test ?Sit to Stand: Able to stand  independently using hands ?Standing Unsupported: Able to stand 2 minutes with supervision ?Sitting with Back Unsupported but Feet Supported on Floor or Stool: Able to sit safely and securely 2 minutes ?Stand to Sit: Controls descent by using hands ?From Standing Position, Pick up Object from Floor: Able to pick up shoe, needs supervision ?From Standing Position, Turn to Look Behind Over each Shoulder: Looks behind from both sides  and weight shifts well ?Turn 360 Degrees: Able to turn 360 degrees safely in 4 seconds or less ?  ?   ? ? ? ?Pertinent Vitals/Pain    ? ? ?Home Living  Family/patient expects to be discharged to:: Private residence ?Living Arrangements: Children (lives with son) ?Available Help at Discharge: Family;Available 24 hours/day ?Type of Home: House ?Home Access: Stairs to enter ?Entrance Stairs-Rails: Right ?Entrance Stairs-Number of Steps: 4 ?Alternate Level Stairs-Number of Steps: 14 (to basement) ?Home Layout: Multi-level;Able to live on main level with bedroom/bathroom;Laundry or work area in basement ?Home Equipment: None ?   ?  ?Prior Function Prior Level of Function : Independent/Modified Independent ?  ?  ?  ?  ?  ?  ?Mobility Comments: no AD use ?ADLs Comments: does IADLs, reports bathing sinkside ?  ? ? ?Hand Dominance  ?   ? ?  ?Extremity/Trunk Assessment  ? Upper Extremity Assessment ?Upper Extremity Assessment: Overall WFL for tasks assessed ?  ? ?Lower Extremity Assessment ?Lower Extremity Assessment: Overall WFL for tasks assessed ?  ? ?Cervical / Trunk Assessment ?Cervical / Trunk Assessment: Normal  ?Communication  ? Communication: No difficulties  ?Cognition Arousal/Alertness: Awake/alert ?Behavior During Therapy: [facility] for tasks assessed/performed ?Overall Cognitive Status: Within Functional Limits for tasks assessed ?  ?  ?  ?  ?  ?  ?  ?  ?  ?  ?  ?  ?  ?  ?  ?  ?General Comments: pt able to recall 2/3 words when given 3 word recall, recalls 3/3 words when given hint. Pt's son & daughter in room, states pt is at baseline cognition ?  ?  ? ?  ?General Comments General comments (skin integrity, edema, etc.): vss,  children present ? ?  ?Exercises    ? ?Assessment/Plan  ?  ?PT Assessment Patient needs continued PT services  ?PT Problem List Decreased balance;Decreased mobility;Decreased coordination ? ?   ?  ?PT Treatment Interventions Gait training;Therapeutic activities;Balance training;Patient/family education   ? ?PT Goals (Current goals can be found in the Care Plan section)  ?Acute Rehab PT Goals ?Patient Stated Goal: home at [facility] ?PT Goal  Formulation: With patient ?Time For Goal Achievement: 11/11/21 ?Potential to Achieve Goals: Good ? ?  ?Frequency Min 3X/week ?  ? ? ?Co-evaluation   ?  ?  ?  ?  ? ? ?  ?AM-PAC PT "6 Clicks" Mobility  ?Outcome Measure Help needed turning from your back to your side while in a flat bed without using bedrails?: None ?Help needed moving from lying on your back to sitting on the side of a flat bed without using bedrails?: None ?Help needed moving to and from a bed to a chair (including a wheelchair)?: A Little ?Help needed standing up from a chair using your arms (e.g., wheelchair or bedside chair)?: A Little ?Help needed to walk in hospital room?: A Little ?Help needed climbing 3-5 steps with a railing? : A Little ?6 Click Score: 20 ? ?  ?End of Session   ?Activity Tolerance: Patient tolerated treatment well ?Patient left: in bed;with call bell/phone within reach;with family/visitor present ?Nurse Communication: Mobility status ?PT Visit Diagnosis: Other abnormalities of gait and mobility (R26.89);Other symptoms and signs involving the nervous system (R29.898) ?  ? ?Time: 0947-0962 ?PT Time Calculation (min) (ACUTE ONLY): 24 min ? ? ?Charges:   PT Evaluation ?$PT Eval Low Complexity: 1 Low ?PT Treatments ?$Gait Training: 8-22 mins ?  ?   ? ? ?11/04/2021 ? ?  Jacinto HalimKen M., PT ?Acute Rehabilitation Services ?[phone]  (pager) ?[phone]  (office) ? ?Eliseo GumKenneth V Torsten Weniger ?11/04/2021, 7:08 PM ? ?

## 2021-11-05 ENCOUNTER — Other Ambulatory Visit: Payer: Self-pay | Admitting: Cardiology

## 2021-11-05 ENCOUNTER — Other Ambulatory Visit (HOSPITAL_COMMUNITY): Payer: Self-pay

## 2021-11-05 DIAGNOSIS — I639 Cerebral infarction, unspecified: Secondary | ICD-10-CM

## 2021-11-05 DIAGNOSIS — N1832 Chronic kidney disease, stage 3b: Secondary | ICD-10-CM

## 2021-11-05 LAB — URINE CULTURE

## 2021-11-05 LAB — CBC WITH DIFFERENTIAL/PLATELET
Abs Immature Granulocytes: 0.01 10*3/uL (ref 0.00–0.07)
Basophils Absolute: 0 10*3/uL (ref 0.0–0.1)
Basophils Relative: 1 %
Eosinophils Absolute: 0.1 10*3/uL (ref 0.0–0.5)
Eosinophils Relative: 3 %
HCT: 32.5 % — ABNORMAL LOW (ref 36.0–46.0)
Hemoglobin: 11 g/dL — ABNORMAL LOW (ref 12.0–15.0)
Immature Granulocytes: 0 %
Lymphocytes Relative: 30 %
Lymphs Abs: 1.2 10*3/uL (ref 0.7–4.0)
MCH: 34 pg (ref 26.0–34.0)
MCHC: 33.8 g/dL (ref 30.0–36.0)
MCV: 100.3 fL — ABNORMAL HIGH (ref 80.0–100.0)
Monocytes Absolute: 0.6 10*3/uL (ref 0.1–1.0)
Monocytes Relative: 15 %
Neutro Abs: 2.1 10*3/uL (ref 1.7–7.7)
Neutrophils Relative %: 51 %
Platelets: 183 10*3/uL (ref 150–400)
RBC: 3.24 MIL/uL — ABNORMAL LOW (ref 3.87–5.11)
RDW: 11.6 % (ref 11.5–15.5)
WBC: 4.2 10*3/uL (ref 4.0–10.5)
nRBC: 0 % (ref 0.0–0.2)

## 2021-11-05 LAB — MAGNESIUM: Magnesium: 2.2 mg/dL (ref 1.7–2.4)

## 2021-11-05 LAB — BASIC METABOLIC PANEL
Anion gap: 7 (ref 5–15)
BUN: 17 mg/dL (ref 8–23)
CO2: 27 mmol/L (ref 22–32)
Calcium: 8.5 mg/dL — ABNORMAL LOW (ref 8.9–10.3)
Chloride: 105 mmol/L (ref 98–111)
Creatinine, Ser: 1.57 mg/dL — ABNORMAL HIGH (ref 0.44–1.00)
GFR, Estimated: 31 mL/min — ABNORMAL LOW (ref >60)
Glucose, Bld: 89 mg/dL (ref 70–99)
Potassium: 3.9 mmol/L (ref 3.5–5.1)
Sodium: 139 mmol/L (ref 135–145)

## 2021-11-05 MED ORDER — SODIUM CHLORIDE 0.9 % IV SOLN: INTRAVENOUS | Status: DC

## 2021-11-05 MED ORDER — HYDRALAZINE HCL 10 MG PO TABS
10.0000 mg | ORAL_TABLET | Freq: Three times a day (TID) | ORAL | 1 refills | Status: DC
  Filled 2021-11-05: qty 90, 30d supply, fill #0

## 2021-11-05 MED ORDER — CARVEDILOL 6.25 MG PO TABS
6.2500 mg | ORAL_TABLET | Freq: Two times a day (BID) | ORAL | Status: DC
  Administered 2021-11-05 (×2): 6.25 mg via ORAL
  Filled 2021-11-05 (×2): qty 1

## 2021-11-05 MED ORDER — ROSUVASTATIN CALCIUM 10 MG PO TABS
10.0000 mg | ORAL_TABLET | Freq: Every day | ORAL | 1 refills | Status: DC
  Filled 2021-11-05: qty 30, 30d supply, fill #0

## 2021-11-05 MED ORDER — HYDRALAZINE HCL 10 MG PO TABS
10.0000 mg | ORAL_TABLET | Freq: Three times a day (TID) | ORAL | Status: DC
  Administered 2021-11-05 (×2): 10 mg via ORAL
  Filled 2021-11-05 (×2): qty 1

## 2021-11-05 MED ORDER — CLOPIDOGREL BISULFATE 75 MG PO TABS
75.0000 mg | ORAL_TABLET | Freq: Every day | ORAL | 2 refills | Status: DC
  Filled 2021-11-05: qty 30, 30d supply, fill #0

## 2021-11-05 MED ORDER — ASPIRIN 81 MG PO TBEC
81.0000 mg | DELAYED_RELEASE_TABLET | Freq: Every day | ORAL | 11 refills | Status: DC
  Filled 2021-11-05: qty 30, 30d supply, fill #0

## 2021-11-05 MED ORDER — ACETAMINOPHEN 325 MG PO TABS: 650.0000 mg | ORAL_TABLET | ORAL | Status: AC | PRN

## 2021-11-05 MED ORDER — FUROSEMIDE 20 MG PO TABS
20.0000 mg | ORAL_TABLET | Freq: Every day | ORAL | 1 refills | Status: DC | PRN
  Filled 2021-11-05: qty 30, 30d supply, fill #0

## 2021-11-05 MED ORDER — ISOSORBIDE MONONITRATE ER 30 MG PO TB24
15.0000 mg | ORAL_TABLET | Freq: Every day | ORAL | 1 refills | Status: DC
  Filled 2021-11-05: qty 30, 60d supply, fill #0

## 2021-11-05 MED ORDER — ISOSORBIDE MONONITRATE ER 30 MG PO TB24
15.0000 mg | ORAL_TABLET | Freq: Every day | ORAL | Status: DC
  Administered 2021-11-05: 15 mg via ORAL
  Filled 2021-11-05: qty 1

## 2021-11-05 MED ORDER — CARVEDILOL 6.25 MG PO TABS
6.2500 mg | ORAL_TABLET | Freq: Two times a day (BID) | ORAL | 1 refills | Status: DC
  Filled 2021-11-05: qty 60, 30d supply, fill #0

## 2021-11-05 NOTE — Progress Notes (Signed)
Physical Therapy Treatment ?Patient Details ?Name: Amber Gonzales ?MRN: [id number] ?DOB: 11/09/1929 ?Today's Date: 11/05/2021 ? ? ?History of Present Illness Pt is an 88 y/o F presenting to ED on 4/30 with LLE weakness and dizziness x3 days, CT imaging showing posterior/inferior acute L cerebellar infarct. PMH includes DM, HTN, and renal disorder. ? ?  ?PT Comments  ? ? Significant progress noted.  Pt does not need follow up.  Emphasis on DGI with balance challenge.  Noted occasional deviation, but no LOB and generally age appropriate mobility and gait speeds. ?   ?Recommendations for follow up therapy are one component of a multi-disciplinary discharge planning process, led by the attending physician.  Recommendations may be updated based on patient status, additional functional criteria and insurance authorization. ? ?Follow Up Recommendations ? No PT follow up ?  ?  ?Assistance Recommended at Discharge PRN  ?Patient can return home with the following Assist for transportation;Assistance with cooking/housework ?  ?Equipment Recommendations ? None recommended by PT  ?  ?Recommendations for Other Services   ? ? ?  ?Precautions / Restrictions Precautions ?Precautions: Fall (lower risk)  ?  ? ?Mobility ? Bed Mobility ?Overal bed mobility: Modified Independent ?  ?  ?  ?  ?  ?  ?  ?  ? ?Transfers ?Overall transfer level: Needs assistance ?  ?Transfers: Sit to/from Stand ?Sit to Stand: Supervision ?  ?  ?  ?  ?  ?  ?  ? ?Ambulation/Gait ?Ambulation/Gait assistance: Supervision ?Gait Distance (Feet): 400 Feet ?Assistive device: None ?Gait Pattern/deviations: Step-through pattern ?Gait velocity: moderate speed ?Gait velocity interpretation: >2.62 ft/sec, indicative of community ambulatory ?  ?General Gait Details: steady, age appropriate speeds, less drift, no overt deviation with scanning today,  Pt also backed up tentatively, changed direction abruptly, walked briskly, stepped over obstacles all without significant  deviation. ? ? ?Stairs ?  ?  ?  ?  ?  ? ? ?Wheelchair Mobility ?  ? ?Modified Rankin (Stroke Patients Only) ?Modified Rankin (Stroke Patients Only) ?Pre-Morbid Rankin Score: No symptoms ?Modified Rankin: Slight disability ? ? ?  ?Balance Overall balance assessment: Needs assistance ?Sitting-balance support: Feet supported ?Sitting balance-Leahy Scale: Good ?  ?  ?Standing balance support: During functional activity ?Standing balance-Leahy Scale: Good ?  ?  ?  ?  ?  ?  ?  ?  ?  ?Standardized Balance Assessment ?Standardized Balance Assessment : Dynamic Gait Index ?Berg Balance Test ?Sit to Stand: Able to stand without using hands and stabilize independently ?Dynamic Gait Index ?Level Surface: Normal ?Change in Gait Speed: Normal ?Gait with Horizontal Head Turns: Normal ?Gait with Vertical Head Turns: Normal ?Gait and Pivot Turn: Normal ?Step Over Obstacle: Mild Impairment ?Step Around Obstacles: Mild Impairment ?Steps: Mild Impairment ?Total Score: 21 ?  ? ?  ?Cognition Arousal/Alertness: Awake/alert ?Behavior During Therapy: [facility] for tasks assessed/performed ?Overall Cognitive Status: Within Functional Limits for tasks assessed ?  ?  ?  ?  ?  ?  ?  ?  ?  ?  ?  ?  ?  ?  ?  ?  ?  ?  ?  ? ?  ?Exercises   ? ?  ?General Comments   ?  ?  ? ?Pertinent Vitals/Pain Pain Assessment ?Pain Assessment: No/denies pain  ? ? ?Home Living   ?  ?  ?  ?  ?  ?  ?  ?  ?  ?   ?  ?Prior Function    ?  ?  ?   ? ?  PT Goals (current goals can now be found in the care plan section) Acute Rehab PT Goals ?Patient Stated Goal: home at [facility] ?PT Goal Formulation: With patient ?Time For Goal Achievement: 11/11/21 ?Potential to Achieve Goals: Good ?Progress towards PT goals: Progressing toward goals ? ?  ?Frequency ? ? ? Min 3X/week ? ? ? ?  ?PT Plan Current plan remains appropriate  ? ? ?Co-evaluation   ?  ?  ?  ?  ? ?  ?AM-PAC PT "6 Clicks" Mobility   ?Outcome Measure ? Help needed turning from your back to your side while in a flat bed without  using bedrails?: None ?Help needed moving from lying on your back to sitting on the side of a flat bed without using bedrails?: None ?Help needed moving to and from a bed to a chair (including a wheelchair)?: A Little ?Help needed standing up from a chair using your arms (e.g., wheelchair or bedside chair)?: A Little ?Help needed to walk in hospital room?: A Little ?Help needed climbing 3-5 steps with a railing? : A Little ?6 Click Score: 20 ? ?  ?End of Session   ?Activity Tolerance: Patient tolerated treatment well ?Patient left: in bed;with call bell/phone within reach;with family/visitor present ?Nurse Communication: Mobility status ?PT Visit Diagnosis: Other abnormalities of gait and mobility (R26.89) ?  ? ? ?Time: 5329-9242 ?PT Time Calculation (min) (ACUTE ONLY): 12 min ? ?Charges:  $Gait Training: 8-22 mins          ?          ? ?11/05/2021 ? ?Jacinto Halim., PT ?Acute Rehabilitation Services ?[phone]  (pager) ?[phone]  (office) ? ? ?Eliseo Gum Marlette Curvin ?11/05/2021, 4:39 PM ? ?

## 2021-11-05 NOTE — Progress Notes (Addendum)
STROKE TEAM PROGRESS NOTE  ? ?INTERVAL HISTORY ?Her 3 children are at the bedside.  Discussed her discontinuing eliquis. She states [facility] about a month ago. She is will to resume or use DAPT therapy, which ever we think is most appropriate. She states that she is doing well today and she is looking forward to going home. She has not had any dizziness, nausea, numbness, weakness, or tingling today.  ? ?Vitals:  ? 11/04/21 2006 11/04/21 2321 11/05/21 0341 11/05/21 0746  ?BP: (!) 160/65 (!) 141/56 (!) 143/55 (!) 142/45  ?Pulse: (!) 59 (!) 56 (!) 52 (!) 54  ?Resp: 16 16 17 18   ?Temp: 98 ?F (36.7 ?C) 97.9 ?F (36.6 ?C) 98 ?F (36.7 ?C) 98.6 ?F (37 ?C)  ?TempSrc:  Oral  Oral  ?SpO2: 98% 99% 98% 99%  ?Weight:      ?Height:      ? ?CBC:  ?Recent Labs  ?Lab 11/04/21 ?[id number] 11/05/21 ?0346  ?WBC 5.5 4.2  ?NEUTROABS 3.2 2.1  ?HGB 11.5* 11.0*  ?HCT 34.0* 32.5*  ?MCV 100.3* 100.3*  ?PLT 199 183  ? ? ?Basic Metabolic Panel:  ?Recent Labs  ?Lab 11/04/21 ?[id number] 11/05/21 ?0346  ?NA 141 139  ?K 3.3* 3.9  ?CL 104 105  ?CO2 28 27  ?GLUCOSE 86 89  ?BUN 13 17  ?CREATININE 1.28* 1.57*  ?CALCIUM 8.8* 8.5*  ?MG 2.1 2.2  ? ? ?Lipid Panel:  ?Recent Labs  ?Lab 11/04/21 ?[id number]  ?CHOL 192  ?TRIG 53  ?HDL 54  ?CHOLHDL 3.6  ?VLDL 11  ?LDLCALC 127*  ? ? ?HgbA1c:  ?Recent Labs  ?Lab 11/04/21 ?[id number]  ?HGBA1C 5.2  ? ?Urine Drug Screen: No results for input(s): LABOPIA, COCAINSCRNUR, LABBENZ, AMPHETMU, THCU, LABBARB in the last 168 hours.  ?Alcohol Level No results for input(s): ETH in the last 168 hours. ? ?IMAGING past 24 hours ?VAS US LOWER EXTREMITY VENOUS (DVT) ? ?Result Date: 11/04/2021 ? Lower Venous DVT Study Patient Name:  Jake SeatsAOMI M Trager  Date of Exam:   11/04/2021 Medical Rec #: [id number]     Accession #:    [id number] Date of Birth: 05/14/1930     Patient Gender: F Patient Age:   [age] years [facility] Procedure:      VAS US LOWER EXTREMITY VENOUS (DVT) Referring Phys: Scheryl MartenJINDONG Bernie Fobes  --------------------------------------------------------------------------------  Indications: Stroke.  Comparison Study: Previous exam on 12/23/20 was positive for DVT in RLE PTV Performing Technologist: Ernestene MentionJody Hill RVT, RDMS  Examination Guidelines: A complete evaluation includes B-mode imaging, spectral Doppler, color Doppler, and power Doppler as needed of all accessible portions of each vessel. Bilateral testing is considered an integral part of a complete examination. Limited examinations for reoccurring indications may be performed as noted. The reflux portion of the exam is performed with the patient in reverse Trendelenburg.  +---------+---------------+---------+-----------+----------+--------------+ RIGHT    CompressibilityPhasicitySpontaneityPropertiesThrombus Aging +---------+---------------+---------+-----------+----------+--------------+ CFV      Full           Yes      Yes                                 +---------+---------------+---------+-----------+----------+--------------+ SFJ      Full                                                        +---------+---------------+---------+-----------+----------+--------------+  FV Prox  Full           Yes      Yes                                 +---------+---------------+---------+-----------+----------+--------------+ FV Mid   Full           Yes      Yes                                 +---------+---------------+---------+-----------+----------+--------------+ FV DistalFull           Yes      Yes                                 +---------+---------------+---------+-----------+----------+--------------+ PFV      Full                                                        +---------+---------------+---------+-----------+----------+--------------+ POP      Full           Yes      Yes                                 +---------+---------------+---------+-----------+----------+--------------+ PTV      Full                                                         +---------+---------------+---------+-----------+----------+--------------+ PERO     Full                                                        +---------+---------------+---------+-----------+----------+--------------+   +---------+---------------+---------+-----------+----------+--------------+ LEFT     CompressibilityPhasicitySpontaneityPropertiesThrombus Aging +---------+---------------+---------+-----------+----------+--------------+ CFV      Full           Yes      Yes                                 +---------+---------------+---------+-----------+----------+--------------+ SFJ      Full                                                        +---------+---------------+---------+-----------+----------+--------------+ FV Prox  Full           Yes      Yes                                 +---------+---------------+---------+-----------+----------+--------------+ FV Mid   Full  Yes      Yes                                 +---------+---------------+---------+-----------+----------+--------------+ FV DistalFull           Yes      Yes                                 +---------+---------------+---------+-----------+----------+--------------+ PFV      Full                                                        +---------+---------------+---------+-----------+----------+--------------+ POP      Full           Yes      Yes                                 +---------+---------------+---------+-----------+----------+--------------+ PTV      Full                                                        +---------+---------------+---------+-----------+----------+--------------+ PERO     Full                                                        +---------+---------------+---------+-----------+----------+--------------+    Summary: BILATERAL: - No evidence of deep vein thrombosis seen  in the lower extremities, bilaterally. - No evidence of superficial venous thrombosis in the lower extremities, bilaterally. -No evidence of popliteal cyst, bilaterally.   *See table(s) above for measurements and observations.    Preliminary    ? ?PHYSICAL EXAM ? ?Physical Exam  ?Constitutional: Appears well-developed and well-nourished.  ?Cardiovascular: Normal rate and regular rhythm.  ?Respiratory: Effort normal, non-labored breathing ? ?Neuro: ?Mental Status: ?Patient is awake, alert, oriented to person, place, month, year, and situation. ?Patient is able to give a clear and coherent history. ?No signs of aphasia or neglect ?Cranial Nerves: ?II: Visual Fields are full. Pupils are equal, round, and reactive to light.   ?III,IV, VI: EOMI without ptosis or diploplia.  ?V: Facial sensation is symmetric to temperature ?VII: Facial movement is symmetric resting and smiling ?VIII: Hearing is intact to voice ?X: Palate elevates symmetrically ?XI: Shoulder shrug is symmetric. ?XII: Tongue protrudes midline without atrophy or fasciculations.  ?Motor: ?Tone is normal. Bulk is normal. 5/5 strength was present in all four extremities.  ?Sensory: ?Sensation is symmetric to light touch and temperature in the arms and legs. No extinction to DSS present.  ?Cerebellar: ?RAM slowed with right hand. Mild ataxia noted with HKS in right leg. Orbiting left around right with upper extremities.  ?Gait not assessed for safety. ? ?ASSESSMENT/PLAN ?Ms. MAESYN FRISINGER is a 88 y.o. female with history of  HTN, CKD 3, HLD, some degree of dementia per PCP notes,  prior PE no longer on eliquis presenting with left lower extremity weakness and vertigo starting approximately a week ago. She states that she felt like she was drunk and would tilt while she was walking. She denies a sensation of dizziness or spinning.  MRI shows a large inferior left cerebellar acute infarct with edema and partial effacement of the left inferior fourth ventricle and  left foramen of Luschka, but no hydrocephalus.  PT/OT to evaluate.  Currently on DAPT therapy with aspirin 81 mg and Plavix 75 mg daily.  Cardiology consulted for EF.  They will order 30-day monitor at discharge and follow-up with her outpatient. ? ?Stroke:  left PICA large infarct likely secondary intracranial stenosis vs embolic source ?Code Stroke CT head-

## 2021-11-05 NOTE — TOC Initial Note (Signed)
Transition of Care (TOC) - Initial/Assessment Note  ? ? ?Patient Details  ?Name: Amber Gonzales ?MRN: [id number] ?Date of Birth: 08/06/1929 ? ?Transition of Care (TOC) CM/SW Contact:    ?Pollie Friar, RN ?Phone Number: ?11/05/2021, 11:37 AM ? ?Clinical Narrative:                 ?Patient is from home with adult children. She has walker, cane and shower seat at home but doesn't use them.  ?Pt manages her own meds at home. She states she sometimes misplaces them. CM encouraged use of a pill box and asked her daughter present in the room to assist.  ?Pt denies issues with transportation.  ?TOC following. ? ?Expected Discharge Plan: Home/Self Care ?Barriers to Discharge: Continued Medical Work up ? ? ?Patient Goals and CMS Choice ?  ?  ?  ? ?Expected Discharge Plan and Services ?Expected Discharge Plan: Home/Self Care ?  ?  ?  ?Living arrangements for the past 2 months: [place] ?                ?  ?  ?  ?  ?  ?  ?  ?  ?  ?  ? ?Prior Living Arrangements/Services ?Living arrangements for the past 2 months: [place] ?Lives with:: Adult Children ?Patient language and need for interpreter reviewed:: Yes ?Do you feel safe going back to the place where you live?: Yes      ?Need for Family Participation in Patient Care: Yes (Comment) ?Care giver support system in place?: Yes (comment) ?Current home services: DME (cane and walker) ?Criminal Activity/Legal Involvement Pertinent to Current Situation/Hospitalization: No - Comment as needed ? ?Activities of Daily Living ?  ?  ? ?Permission Sought/Granted ?  ?  ?   ?   ?   ?   ? ?Emotional Assessment ?Appearance:: Appears stated age ?Attitude/Demeanor/Rapport: Engaged ?Affect (typically observed): Accepting ?Orientation: : Oriented to Self, Oriented to Place, Oriented to  Time, Oriented to Situation ?  ?Psych Involvement: No (comment) ? ?Admission diagnosis:  Dizziness [R42] ?Cerebellar stroke ([place]) [I63.9] ?Weakness of left lower extremity  [R29.898] ?Cerebrovascular accident (CVA), unspecified mechanism ([place]) [I63.9] ?Patient Active Problem List  ? Diagnosis Date Noted  ? Stage 3b chronic kidney disease (CKD) ([place]) 11/04/2021  ? History of pulmonary embolism 11/04/2021  ? 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 Abnormal echocardiogram 11/04/2021  ? Dizziness   ? Cerebellar stroke ([place]) 11/03/2021  ? Pulmonary emboli ([place]) 12/22/2020  ? LEG CRAMPS 06/25/2010  ? MAMMOGRAM, ABNORMAL, LEFT 03/07/2010  ? HAIR LOSS 10/09/2009  ? DM2 (diabetes mellitus, type 2) ([place]) 06/06/2009  ? NEPHROSCLEROSIS 01/31/2009  ? 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 GERD 05/20/2007  ? OSTEOPOROSIS 05/20/2007  ? HLD (hyperlipidemia) 03/31/2007  ? Essential hypertension 03/31/2007  ? RENAL DISEASE, CHRONIC, MILD 03/31/2007  ? SHOULDER PAIN, RIGHT, HX OF 03/31/2007  ? ?PCP:  Aretta Nip, MD ?Pharmacy:   ?[id number] [address] ?[address] ?[place] [address] ?Phone: [phone] Fax: [phone] ? ? ? ? ?Social Determinants of Health (SDOH) Interventions ?  ? ?Readmission Risk Interventions ?   ? View : No data to display.  ?  ?  ?  ? ? ? ?

## 2021-11-05 NOTE — Discharge Summary (Signed)
Physician Discharge Summary  ?Amber Gonzales ZOX:[id number] DOB: [birth year] DOA: 11/03/2021 ? ?PCP: Clayborn Heron, MD ? ?Admit date: 11/03/2021 ?Discharge date: 11/05/2021 ? ?Time spent: 60 minutes ? ?Recommendations for Outpatient Follow-up:  ?Follow-up with Dr. Val EagleJennette Kettle, cardiology 01/16/2022 at 9 AM. ?Follow-up with [facility] in 1 month. ?Follow-up with Rankins, Fanny Dance, MD in 2 weeks.  On follow-up patient need a basic metabolic profile done to follow-up on electrolytes and renal function.  Patient blood pressure need to be reassessed and followed up upon. ? ? ?Discharge Diagnoses:  ?Principal Problem: ?  Cerebellar stroke (HCC) ?Active Problems: ?  HLD (hyperlipidemia) ?  Essential hypertension ?  DM2 (diabetes mellitus, type 2) (HCC) ?  Stage 3b chronic kidney disease (CKD) (HCC) ?  History of pulmonary embolism ?  Middle cerebral artery aneurysm ?  Abnormal echocardiogram ?  Dizziness ? ? ?Discharge Condition: Stable and improved ? ?Diet recommendation: Heart healthy ? ?Filed Weights  ? 11/03/21 1554  ?Weight: 53.1 kg  ? ? ?History of present illness:  ?HPI per Dr. Julian Reil ?: Amber Gonzales is a 88 y.o. female with medical history significant of HTN, CKD 3, HLD, some degree of dementia per PCP notes, prior PE no longer on eliquis. ?  ?Prior diagnosis of DM2, but looks like just diet controlled at this point with good A1C. ?  ?[facility] with c/o LLE weakness and vertigo.  Feels like she is drunk when walking.  Symptoms constant and persistent.  Onset 2-3 days ago per EDP, ~1 week per patient. ? ?Hospital Course:  ? ?Assessment and Plan: ?* Cerebellar stroke (HCC) ?Patient had presented with left lower extremity weakness and vertiginous symptoms. ?-Patient admitted for stroke work-up. ?Neuro consulted ?CTA head and neck: ?See above ?Looks like she has some significant arteriosclerotic dz. ?Also a 35mm questionable aneurysm ?2d echo with EF of 35 to 40%, left ventricular  global hypokinesis, mild concentric LVH.  No source of emboli noted. ?MRI brain done with large inferior left cerebellar acute infarct.  Associated edema with partial effacement of the inferior left fourth ventricle and left forearm and upper lip scar.  No hydronephrosis noted. ?Fasting lipid panel with LDL of 127\ ?Hemoglobin A1c 5.2. ?Patient seen in consultation by neurology and lower extremity Dopplers ordered which are currently pending. ?Patient started on aspirin and Plavix for DAPT x3 months then aspirin alone given intracranial stenosis. ?Patient started on Crestor 10 mg daily. ?Patient maintained on telemetry during the hospitalization ?Will need 30-day event monitor on discharge ?Patient seen by PT/OT/SLP and no home health needs were recommended. ?Outpatient follow-up with neurology. ? ?Essential hypertension ?Home BP meds held on admission to allow permissive hypertension. ?Patient started on Norvasc 5 mg daily as well as home regimen atenolol.  Hydralazine as needed. ?Due to abnormal 2D echo patient seen in consultation by cardiology and antihypertensive regimen changed.  Norvasc and atenolol were discontinued and patient started on Coreg 6.25 mg twice daily, hydralazine 10 mg 3 times daily, Imdur 15 mg daily. ?Outpatient follow-up with PCP and cardiology. ? ?HLD (hyperlipidemia) ?Lipid panel with LDL of 127. ?Given new stroke, evidence of atherosclerosis of the cerebral and cerebellar arteries patient started on statin Crestor 10 mg daily which we will continue. ?Outpatient follow-up. ? ?Middle cerebral artery aneurysm ?40mm possible aneurysm at bifurcation of MCA ?Monitor as outpt per neuro ? ?History of pulmonary embolism ?History of PE in June 2022. ?Was on eliquis after the PE but  sounds like they took her off of eliquis at some point. ? ?Stage 3b chronic kidney disease (CKD) (HCC) ?Creat 1.4 on admission felt to be likely close to baseline.   ?-CKD 3b documented in outside notes. ?CKD 3b also on  DC summary from her PE admit in June 2022 ?-Outpatient follow-up. ? ?DM2 (diabetes mellitus, type 2) (HCC) ?On patients chart, but not on any meds to control this, and her A1C was 5.6 just last month. ?-Repeat hemoglobin A1c of 5.2. ?-Outpatient follow-up with PCP. ? ?Abnormal echocardiogram ?- 2D echo with EF of 35 to 40%, left ventricle with global hypokinesis, mild concentric LVH, mildly dilated left atrial size. ?-2D echo from 12/2020 with normal EF,NWMA. ?-Patient presented with a cerebellar CVA. ?-Fasting lipid panel with LDL of 127. ?-Patient started on Crestor. ?-Due to abnormal 2D echo cardiology was consulted. ?-Patient was seen in consultation by cardiology who recommended medical management for patient's cardiomyopathy. ?-Atenolol discontinued patient started on Coreg 6.25 mg twice daily, hydralazine 10 mg 3 times daily, Imdur 15 mg daily. ?-Recommended Lasix as needed. ?-Outpatient follow-up with cardiology.. ? ? ? ? ?  ? ?Procedures: ?CT angiogram head and neck 11/03/2021 ?MRI brain 11/04/2021 ?2D echo 11/04/2021 ?Lower extremity Dopplers 11/04/2021  ? ?Consultations: ?Neurology: Dr.Khaliqdina 11/04/2021 ?Cardiology: Dr. Flora Lipps 11/04/2021 ? ?Discharge Exam: ?Vitals:  ? 11/05/21 0746 11/05/21 1554  ?BP: (!) 142/45 (!) 117/56  ?Pulse: (!) 54 62  ?Resp: 18 16  ?Temp: 98.6 ?F (37 ?C) 98.9 ?F (37.2 ?C)  ?SpO2: 99% 98%  ? ? ?General: NAD ?Cardiovascular: Regular rate rhythm no murmurs rubs or gallops.  No JVD.  No lower extremity edema ?Respiratory: Clear to auscultation bilaterally.  No wheezes, no crackles, no rhonchi.  Fair air movement.  Speaking in full sentences. ? ?Discharge Instructions ? ? ?Discharge Instructions   ? ? Ambulatory referral to Neurology   Complete by: As directed ?  ? An appointment is requested in approximately: 4 weeks  ? Diet - low sodium heart healthy   Complete by: As directed ?  ? Increase activity slowly   Complete by: As directed ?  ? ?  ? ?Allergies as of 11/05/2021   ?No Known Allergies ?   ? ?  ?Medication List  ?  ? ?STOP taking these medications   ? ?amLODipine 5 MG tablet ?Commonly known as: NORVASC ?  ?atenolol 50 MG tablet ?Commonly known as: TENORMIN ?  ?Eliquis DVT/PE Starter Pack ?Generic drug: Apixaban Starter Pack (10mg  and 5mg ) ?  ? ?  ? ?TAKE these medications   ? ?acetaminophen 325 MG tablet ?Commonly known as: TYLENOL ?Take 2 tablets (650 mg total) by mouth every 4 (four) hours as needed for mild pain (or temp > 37.5 C (99.5 F)). ?What changed:  ?medication strength ?how much to take ?when to take this ?reasons to take this ?  ?aspirin 81 MG EC tablet ?Take 1 tablet (81 mg total) by mouth daily. Swallow whole. ?Start taking on: Nov 06, 2021 ?  ?carvedilol 6.25 MG tablet ?Commonly known as: COREG ?Take 1 tablet (6.25 mg total) by mouth 2 (two) times daily with a meal. ?  ?clopidogrel 75 MG tablet ?Commonly known as: PLAVIX ?Take 1 tablet (75 mg total) by mouth daily. ?Start taking on: Nov 06, 2021 ?  ?furosemide 20 MG tablet ?Commonly known as: Lasix ?Take 1 tablet (20 mg total) by mouth daily as needed for fluid or edema. ?  ?hydrALAZINE 10 MG tablet ?Commonly known as: APRESOLINE ?Take 1  tablet (10 mg total) by mouth every 8 (eight) hours. ?What changed:  ?medication strength ?how much to take ?when to take this ?  ?isosorbide mononitrate 30 MG 24 hr tablet ?Commonly known as: IMDUR ?Take 0.5 tablets (15 mg total) by mouth daily. ?Start taking on: Nov 06, 2021 ?  ?rosuvastatin 10 MG tablet ?Commonly known as: CRESTOR ?Take 1 tablet (10 mg total) by mouth daily. ?Start taking on: Nov 06, 2021 ?  ?VITAMIN D3 PO ?Take 1 capsule by mouth daily. ?  ? ?  ? ?No Known Allergies ? Follow-up Information   ? ? Rankins, Fanny DanceVictoria R, MD. Schedule an appointment as soon as possible for a visit in 2 week(s).   ?Specialty: Family Medicine ?Contact information: ?[address] ?[place] [address] ?[phone] ? ? ?  ?  ? ? [facility]. Go to .   ?Specialty: Emergency  Medicine ?Why: If symptoms worsen ?Contact information: ?[address] ?[place] [phone] ?[phone] ? ?  ?  ? ? Sande Rives'Fiorentino, [place] Thomas, MD Follow up on 01/16/2022.   ?Speci

## 2021-11-05 NOTE — TOC Transition Note (Signed)
Transition of Care (TOC) - CM/SW Discharge Note ? ? ?Patient Details  ?Name: Amber Gonzales ?MRN: [id number] ?Date of Birth: 07-15-1929 ? ?Transition of Care (TOC) CM/SW Contact:  ?Kermit Balo, RN ?Phone Number: ?11/05/2021, 3:50 PM ? ? ?Clinical Narrative:    ?Patient is discharging home with self care. No needs per PT/OT. ?Medications for home to be delivered to the room per [facility] pharmacy.  ?Pt has transportation home.  ? ? ?Final next level of care: Home/Self Care ?Barriers to Discharge: No Barriers Identified ? ? ?Patient Goals and CMS Choice ?  ?  ?  ? ?Discharge Placement ?  ?           ?  ?  ?  ?  ? ?Discharge Plan and Services ?  ?  ?           ?  ?  ?  ?  ?  ?  ?  ?  ?  ?  ? ?Social Determinants of Health (SDOH) Interventions ?  ? ? ?Readmission Risk Interventions ?   ? View : No data to display.  ?  ?  ?  ? ? ? ? ? ?

## 2021-11-05 NOTE — Progress Notes (Signed)
?Cardiology Progress Note  ?Patient ID: Amber Gonzales ?MRN: [id number] ?DOB: 09/16/1929 ?Date of Encounter: 11/05/2021 ? ?Primary Cardiologist: None ? ?Subjective  ? ?Chief Complaint: None.  ? ?HPI: Denies chest pain or trouble breathing.  No symptoms of congestive heart failure. ? ?ROS:  ?All other ROS reviewed and negative. Pertinent positives noted in the HPI.    ? ?Inpatient Medications  ?Scheduled Meds: ? aspirin EC  81 mg Oral Daily  ? carvedilol  6.25 mg Oral BID WC  ? clopidogrel  75 mg Oral Daily  ? enoxaparin (LOVENOX) injection  30 mg Subcutaneous Q24H  ? hydrALAZINE  10 mg Oral Q8H  ? isosorbide mononitrate  15 mg Oral Daily  ? rosuvastatin  10 mg Oral Daily  ? ?Continuous Infusions: ? ?PRN Meds: ?acetaminophen **OR** acetaminophen (TYLENOL) oral liquid 160 mg/5 mL **OR** acetaminophen, hydrALAZINE  ? ?Vital Signs  ? ?Vitals:  ? 11/04/21 1617 11/04/21 2006 11/04/21 2321 11/05/21 0341  ?BP: (!) 141/59 (!) 160/65 (!) 141/56 (!) 143/55  ?Pulse: 67 (!) 59 (!) 56 (!) 52  ?Resp: 16 16 16 17   ?Temp: 98 ?F (36.7 ?C) 98 ?F (36.7 ?C) 97.9 ?F (36.6 ?C) 98 ?F (36.7 ?C)  ?TempSrc: Oral  Oral   ?SpO2: 100% 98% 99% 98%  ?Weight:      ?Height:      ? ?No intake or output data in the 24 hours ending 11/05/21 0635 ? ?  11/03/2021  ?  3:54 PM 12/22/2020  ?  5:13 PM 10/29/2020  ? 12:20 PM  ?Last 3 Weights  ?Weight (lbs) 117 lb 120 lb 1.6 oz 133 lb  ?Weight (kg) 53.071 kg 54.477 kg 60.328 kg  ?   ? ?Telemetry  ?Overnight telemetry shows SB 50s, which I personally reviewed.  ? ?Physical Exam  ? ?Vitals:  ? 11/04/21 1617 11/04/21 2006 11/04/21 2321 11/05/21 0341  ?BP: (!) 141/59 (!) 160/65 (!) 141/56 (!) 143/55  ?Pulse: 67 (!) 59 (!) 56 (!) 52  ?Resp: 16 16 16 17   ?Temp: 98 ?F (36.7 ?C) 98 ?F (36.7 ?C) 97.9 ?F (36.6 ?C) 98 ?F (36.7 ?C)  ?TempSrc: Oral  Oral   ?SpO2: 100% 98% 99% 98%  ?Weight:      ?Height:      ? No intake or output data in the 24 hours ending 11/05/21 0635  ? ?  11/03/2021  ?  3:54 PM 12/22/2020  ?  5:13 PM 10/29/2020   ? 12:20 PM  ?Last 3 Weights  ?Weight (lbs) 117 lb 120 lb 1.6 oz 133 lb  ?Weight (kg) 53.071 kg 54.477 kg 60.328 kg  ?  Body mass index is 21.4 kg/m?.  ?General: Well nourished, well developed, in no acute distress ?Head: Atraumatic, normal size  ?Eyes: PEERLA, EOMI  ?Neck: Supple, no JVD ?Endocrine: No thryomegaly ?Cardiac: Normal S1, S2; RRR; no murmurs, rubs, or gallops ?Lungs: Clear to auscultation bilaterally, no wheezing, rhonchi or rales  ?Abd: Soft, nontender, no hepatomegaly  ?Ext: No edema, pulses 2+ ?Musculoskeletal: No deformities, BUE and BLE strength normal and equal ?Skin: Warm and dry, no rashes   ?Neuro: Alert and oriented to person, place, time, and situation, CNII-XII grossly intact, no focal deficits  ?Psych: Normal mood and affect  ? ?Labs  ?High Sensitivity Troponin:   ?Recent Labs  ?Lab 11/03/21 ?1625 11/03/21 ?1835  ?TROPONINIHS 13 14  ?   ?Cardiac EnzymesNo results for input(s): TROPONINI in the last 168 hours. No results for input(s): TROPIPOC in the last  168 hours.  ?Chemistry ?Recent Labs  ?Lab 11/03/21 ?1625 11/04/21 ?2992 11/05/21 ?0346  ?NA 141 141 139  ?K 3.2* 3.3* 3.9  ?CL 104 104 105  ?CO2 28 28 27   ?GLUCOSE 111* 86 89  ?BUN 17 13 17   ?CREATININE 1.35* 1.28* 1.57*  ?CALCIUM 9.1 8.8* 8.5*  ?GFRNONAA 38* 40* 31*  ?ANIONGAP 9 9 7   ?  ?Hematology ?Recent Labs  ?Lab 11/03/21 ?1625 11/04/21 ? 11/05/21 ?0346  ?WBC 4.9 5.5 4.2  ?RBC 3.40* 3.39* 3.24*  ?HGB 11.2* 11.5* 11.0*  ?HCT 34.3* 34.0* 32.5*  ?MCV 100.9* 100.3* 100.3*  ?MCH 32.9 33.9 34.0  ?MCHC 32.7 33.8 33.8  ?RDW 11.9 11.7 11.6  ?PLT 206 199 183  ? ?BNPNo results for input(s): BNP, PROBNP in the last 168 hours.  ?DDimer No results for input(s): DDIMER in the last 168 hours.  ? ?Radiology  ?CT ANGIO HEAD NECK W WO CM ? ?Result Date: 11/03/2021 ?CLINICAL DATA:  Dizziness EXAM: CT ANGIOGRAPHY HEAD AND NECK TECHNIQUE: Multidetector CT imaging of the head and neck was performed using the standard protocol during bolus administration  of intravenous contrast. Multiplanar CT image reconstructions and MIPs were obtained to evaluate the vascular anatomy. Carotid stenosis measurements (when applicable) are obtained utilizing NASCET criteria, using the distal internal carotid diameter as the denominator. RADIATION DOSE REDUCTION: This exam was performed according to the departmental dose-optimization program which includes automated exposure control, adjustment of the mA and/or kV according to patient size and/or use of iterative reconstruction technique. CONTRAST:  93mL OMNIPAQUE IOHEXOL 350 MG/ML SOLN COMPARISON:  CT head 12/10/2020 FINDINGS: CT HEAD FINDINGS Brain: Hypodensity and loss of the sulci in the left cerebellum, predominantly posteriorly and inferiorly, as well as the left inferior vermis, which is new from the prior exam. No other new hypodensity. No acute hemorrhage, mass, mass effect, or midline shift. No hydrocephalus. The fourth ventricle remains patent. Periventricular white matter changes, likely the sequela of chronic small vessel ischemic disease. Vascular: No hyperdense vessel. Atherosclerotic calcifications in the intracranial carotid and vertebral arteries. Skull: Hyperostosis frontalis.  No acute fracture or focal lesion. Sinuses: Imaged portions are clear. Orbits: Status post right lens replacement. Review of the MIP images confirms the above findings CTA NECK FINDINGS Aortic arch: Standard branching. Imaged portion shows no evidence of aneurysm or dissection. No significant stenosis of the major arch vessel origins. Right carotid system: No evidence of dissection, occlusion, or hemodynamically significant stenosis (greater than 50%). Left carotid system: Proximally 50% stenosis of the proximal left ICA. No dissection or occlusion. Vertebral arteries: Severe stenosis at the origin of the left vertebral artery and in the proximal V4 segment. The remainder of the left vertebral artery is patent skull base. The right vertebral  artery is patent from its origin to the skull base. No dissection or occlusion. Skeleton: No acute osseous abnormality. Other neck: Subcentimeter nodules in the thyroid, for which no follow-up is indicated. (Reference: J Am Coll Radiol. 2015 Feb;12(2): 143-50) Upper chest: No focal pulmonary opacity or pleural effusion. Review of the MIP images confirms the above findings CTA HEAD FINDINGS Anterior circulation: Both internal carotid arteries are patent to the termini, with circumferential calcifications in the bilateral cavernous and proximal supraclinoid segments, which cause moderate stenosis bilaterally. A1 segments patent. Normal anterior communicating artery. Short segment severe narrowing in the mid right A2 (series 11, image 87) and moderate narrowing in the more distal right ACA (series 11, image 50). Anterior cerebral arteries are patent to their distal  aspects. Moderate to severe narrowing in the distal right M1 (series 11, image 96), with focal dilatation at the MCA bifurcation measuring up to 3 mm (series 11, image 97 and series 13, image 135), most likely an MCA bifurcation aneurysm. No right M1 stenosis. Distal MCA branches are perfused but irregular. Posterior circulation: Vertebral arteries patent to the vertebrobasilar junction, with mild narrowing of the right V4 and moderate narrowing in the left V4, both proximal to the takeoff of the picas. Severe stenosis at the origin of the left PICA (series 11, image 125) and in the proximal left PICA (series 11, image 130). The left PICA is otherwise patent. Similar severe stenosis at the origin of the right PICA (series 11, image 132 and in the proximal vessel (series 11, image 131 Basilar patent to its distal aspect, with multifocal irregularity. The right superior cerebellar artery is patent proximally. The left superior cerebellar artery is poorly perfused and likely severely stenosed at its origin (series 11, image 100). Patent P1 segments. Moderate  narrowing in the proximal left P2 segment (series 11, image 98), severe narrowing in the more distal left P2 (series 11, image 99), with poor visualization of the left P3 branches, although visualizatio

## 2021-11-06 ENCOUNTER — Other Ambulatory Visit: Payer: Self-pay | Admitting: Cardiology

## 2021-11-06 DIAGNOSIS — R42 Dizziness and giddiness: Secondary | ICD-10-CM

## 2021-11-06 DIAGNOSIS — I4891 Unspecified atrial fibrillation: Secondary | ICD-10-CM

## 2021-11-06 DIAGNOSIS — I639 Cerebral infarction, unspecified: Secondary | ICD-10-CM

## 2021-11-21 ENCOUNTER — Other Ambulatory Visit (HOSPITAL_COMMUNITY): Payer: Self-pay

## 2021-11-21 ENCOUNTER — Telehealth (HOSPITAL_COMMUNITY): Payer: Self-pay

## 2021-11-21 DIAGNOSIS — E119 Type 2 diabetes mellitus without complications: Secondary | ICD-10-CM | POA: Diagnosis not present

## 2021-11-21 NOTE — Telephone Encounter (Signed)
Transitions of Care Pharmacy  ° °Call attempted for a pharmacy transitions of care follow-up.  ° °Call attempt #1. Will follow-up in 2-3 days.  °  °

## 2021-11-23 ENCOUNTER — Ambulatory Visit (INDEPENDENT_AMBULATORY_CARE_PROVIDER_SITE_OTHER): Payer: Medicare Other

## 2021-11-23 DIAGNOSIS — I639 Cerebral infarction, unspecified: Secondary | ICD-10-CM

## 2021-11-23 DIAGNOSIS — I4891 Unspecified atrial fibrillation: Secondary | ICD-10-CM

## 2021-11-23 DIAGNOSIS — R42 Dizziness and giddiness: Secondary | ICD-10-CM | POA: Diagnosis not present

## 2021-11-26 ENCOUNTER — Other Ambulatory Visit (HOSPITAL_COMMUNITY): Payer: Self-pay

## 2021-11-26 ENCOUNTER — Telehealth (HOSPITAL_COMMUNITY): Payer: Self-pay

## 2021-11-26 NOTE — Telephone Encounter (Signed)
Transitions of Care Pharmacy   Call attempted for a pharmacy transitions of care follow-up. HIPAA appropriate voicemail was left with call back information provided.   Call attempt #2. Will follow-up in 2-3 days.    

## 2021-11-26 NOTE — Telephone Encounter (Signed)
Pharmacy Transitions of Care Follow-up Telephone Call  Date of discharge: 11/05/21  Discharge Diagnosis: Cerebellar stroke  How have you been since you were released from the hospital? Doing well after discharge.   Medication changes made at discharge: START taking: Aspirin Low Dose (aspirin EC)  carvedilol (COREG)  clopidogrel (PLAVIX)  furosemide (Lasix)  isosorbide mononitrate (IMDUR)  rosuvastatin (CRESTOR)  CHANGE how you take: acetaminophen (TYLENOL)  hydrALAZINE (APRESOLINE)  STOP taking: amLODipine 5 MG tablet (NORVASC)  atenolol 50 MG tablet (TENORMIN)  Eliquis DVT/PE Starter Pack (Apixaban Starter Pack (10mg  and 5mg ))   Medication changes verified by the patient? Yes  Medication Accessibility:  Home Pharmacy: [facility] -- [address]   Was the patient provided with refills on discharged medications? Yes   Have all prescriptions been transferred from [facility] to home pharmacy? Yes   Is the patient able to afford medications? Yes    Medication Review:  CLOPIDOGREL (PLAVIX) Clopidogrel 75 mg once daily.  - Reviewed potential DDIs with patient  - Advised patient of medications to avoid (NSAIDs, ASA)  - Educated that Tylenol (acetaminophen) will be the preferred analgesic to prevent risk of bleeding  - Emphasized importance of monitoring for signs and symptoms of bleeding (abnormal bruising, prolonged bleeding, nose bleeds, bleeding from gums, discolored urine, black tarry stools)  - Advised patient to alert all providers of anticoagulation therapy prior to starting a new medication or having a procedure   Follow-up Appointments:  PCP Hospital f/u appt confirmed? Scheduled to see , NP on 12/04/21 @ 9:30 am.   If their condition worsens, is the pt aware to call PCP or go to the Emergency Dept.? Yes  Final Patient Assessment: -Pt is doing well.  -Pt verbalized understanding of medications and side effects. -Pt has post discharge appointment  [facility]

## 2021-12-03 NOTE — Progress Notes (Unsigned)
PATIENT: Amber Gonzales DOB: 05-Oct-1929  REASON FOR VISIT: follow up HISTORY FROM: patient PRIMARY NEUROLOGIST: Dr. Pearlean Brownie   Chief Complaint  Patient presents with   Follow-up    Pt in 19 with daughter  pt is here for stroke follow up  pt states she has a little pain in left leg . Pt state she feels better      HISTORY OF PRESENT ILLNESS:  HISTORY (copied from Dr. Roda Shutters notes):   Presented to ED  on 11/03/21 with LLE weakness and vertigo that started a week prior.  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 Severe stenosis at the origin of the left SCA, moderate stenosis in the bilateral cavernous and supraclinoid ICA, severe narrowing in the mid right A2, moderate narrowing in the more distal right ACA moderate to severe narrowing in the distal right M1, moderate narrowing of the left V4, and multifocal moderate and severe narrowing in the left P2 segment, with poor perfusion of the left P3 branches. 3 mm outpouching at the right MCA bifurcation, concerning for small aneurysm. 50% stenosis in the proximal left ICA. Severe stenosis at the origin of the left vertebral artery and proximal left V1 segment. MRI  Large inferior left cerebellar acute infarct. Associated edema with partial effacement the inferior left fourth ventricle and left foramen of Luschka. No hydrocephalus at this time 2D Echo EF 35-40%, LV and moderately decreased function Bilateral lower extremity duplex-no DVT LDL 127 HgbA1c 5.6 VTE prophylaxis -Lovenox No antithrombotic prior to admission, now on aspirin 81 mg daily and clopidogrel 75 mg daily for 3 months and then ASA alone given left PICA occlusion  Today 12/03/21,   Reports symptoms are better. Dizziness is better, weakness in legs have  improved occasionally feels weaker in left foot. Lives at home with son. Daughter checks on her daily. Good appetite. BP high today but hasn't taken her BP meds yet. Checks her BP at home. Currently has cardiac monitor. FU scheduled with cardiology 7/13. No family or personal History of cerebral aneursym or Dissection. not a  Smoker. Walks 1 mile each week.   REVIEW OF SYSTEMS: Out of a complete 14 system review of symptoms, the patient complains only of the following symptoms, and all other reviewed systems are negative.  ALLERGIES: No Known Allergies  HOME MEDICATIONS: Outpatient Medications Prior to Visit  Medication Sig Dispense Refill   acetaminophen (TYLENOL) 325 MG tablet Take 2 tablets (650 mg total) by mouth every 4 (four) hours as needed for mild pain (or temp > 37.5 C (99.5 F)).     aspirin 81 MG EC tablet Take 1 tablet (81 mg total) by mouth daily. Swallow whole. 30 tablet 11   carvedilol (COREG) 6.25 MG tablet Take 1 tablet (6.25 mg total) by mouth 2 (two) times daily with a meal. 60 tablet 1   Cholecalciferol (VITAMIN D3 PO) Take 1 capsule by mouth daily.     clopidogrel (PLAVIX) 75 MG tablet Take 1 tablet (75 mg total) by mouth daily. 30 tablet 2   furosemide (LASIX) 20 MG tablet Take 1 tablet (20 mg total) by mouth daily as needed for fluid or edema. 30 tablet 1   hydrALAZINE (APRESOLINE) 10 MG tablet Take 1 tablet (10 mg total) by mouth every 8 (eight)  hours. 90 tablet 1   isosorbide mononitrate (IMDUR) 30 MG 24 hr tablet Take 0.5 tablets (15 mg total) by mouth daily. 30 tablet 1   rosuvastatin (CRESTOR) 10 MG tablet Take 1 tablet (10 mg total) by mouth daily. 30 tablet 1   No facility-administered medications prior to visit.    PAST MEDICAL HISTORY: Past Medical History:  Diagnosis Date   Diabetes mellitus without complication (HCC)    Hypertension    Renal disorder     PAST SURGICAL HISTORY: No past surgical history on file.  FAMILY HISTORY: No family history on  file.  SOCIAL HISTORY: Social History   Socioeconomic History   Marital status: Married    Spouse name: Not on file   Number of children: Not on file   Years of education: Not on file   Highest education level: Not on file  Occupational History   Not on file  Tobacco Use   Smoking status: Never   Smokeless tobacco: Never  Substance and Sexual Activity   Alcohol use: Never   Drug use: Not on file   Sexual activity: Not on file  Other Topics Concern   Not on file  Social History Narrative   Not on file   Social Determinants of Health   Financial Resource Strain: Not on file  Food Insecurity: Not on file  Transportation Needs: Not on file  Physical Activity: Not on file  Stress: Not on file  Social Connections: Not on file  Intimate Partner Violence: Not on file      PHYSICAL EXAM  Vitals:   12/04/21 0921 12/04/21 1006  BP: (!) 178/98 (!) 175/88  Pulse: 65 62  Weight: 115 lb 12.8 oz (52.5 kg)   Height: 5\' 2"  (1.575 m)    Body mass index is 21.18 kg/m.  Generalized: Well developed, in no acute distress   Neurological examination  Mentation: Alert oriented to time, place, history taking. Follows all commands speech and language fluent Cranial nerve II-XII: Pupils were equal round reactive to light. Extraocular movements were full, visual field were full on confrontational test. Facial sensation and strength were normal.  Head turning and shoulder shrug  were normal and symmetric. Motor: The motor testing reveals 5 over 5 strength of all 4 extremities with exception slightly weaker in left ankle.Good symmetric motor tone is noted throughout.  Sensory: Sensory testing is intact to soft touch on all 4 extremities. No evidence of extinction is noted.  Coordination: Cerebellar testing reveals good finger-nose-finger and heel-to-shin bilaterally.    DIAGNOSTIC DATA (LABS, IMAGING, TESTING) - I reviewed patient records, labs, notes, testing and imaging myself where  available.  Lab Results  Component Value Date   WBC 4.2 11/05/2021   HGB 11.0 (L) 11/05/2021   HCT 32.5 (L) 11/05/2021   MCV 100.3 (H) 11/05/2021   PLT 183 11/05/2021      Component Value Date/Time   NA 139 11/05/2021 0346   K 3.9 11/05/2021 0346   CL 105 11/05/2021 0346   CO2 27 11/05/2021 0346   GLUCOSE 89 11/05/2021 0346   BUN 17 11/05/2021 0346   CREATININE 1.57 (H) 11/05/2021 0346   CALCIUM 8.5 (L) 11/05/2021 0346   PROT 7.1 12/22/2020 0957   ALBUMIN 3.5 12/22/2020 0957   AST 20 12/22/2020 0957   ALT 13 12/22/2020 0957   ALKPHOS 124 12/22/2020 0957   BILITOT 1.0 12/22/2020 0957   GFRNONAA 31 (L) 11/05/2021 0346   GFRAA 37 05/07/2009 0000   Lab Results  Component Value Date   CHOL 192 11/04/2021   HDL 54 11/04/2021   LDLCALC 127 (H) 11/04/2021   TRIG 53 11/04/2021   CHOLHDL 3.6 11/04/2021   Lab Results  Component Value Date   HGBA1C 5.2 11/04/2021   Lab Results  Component Value Date   TSH 1.276 10/09/2009      ASSESSMENT AND PLAN 88 y.o. year old female  has a past medical history of Diabetes mellitus without complication (HCC), Hypertension, and Renal disorder. 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 After three months ( 02/02/22) can stop plavix and continue ASA 81 mg daily LDL goal <70- continue atorvastatin 10 mg HgbA1c goal <6.5 % BP goal <130/90- BP is high today- not taken BP meds this morning. Encouraged patient to take BP meds once she gets home. Recheck BP in 2 hours. If BP consistently running high contact PCP Currently has Cardiac monitor 30-day Repeat CT angiogram in 6 months Monitor diet and encouraged exercise  FU in 6 months or sooner if needed.   Butch Penny, MSN, NP-C 12/03/2021, 3:46 PM [facility] [address] [place], [place] [address] [phone]

## 2021-12-04 ENCOUNTER — Ambulatory Visit: Payer: Medicare Other | Admitting: Adult Health

## 2021-12-04 ENCOUNTER — Encounter: Payer: Self-pay | Admitting: Adult Health

## 2021-12-04 VITALS — BP 175/88 | HR 62 | Ht 62.0 in | Wt 115.8 lb

## 2021-12-04 DIAGNOSIS — N183 Chronic kidney disease, stage 3 unspecified: Secondary | ICD-10-CM | POA: Diagnosis not present

## 2021-12-04 DIAGNOSIS — I63532 Cerebral infarction due to unspecified occlusion or stenosis of left posterior cerebral artery: Secondary | ICD-10-CM

## 2021-12-04 DIAGNOSIS — I10 Essential (primary) hypertension: Secondary | ICD-10-CM

## 2021-12-04 DIAGNOSIS — R9389 Abnormal findings on diagnostic imaging of other specified body structures: Secondary | ICD-10-CM | POA: Diagnosis not present

## 2021-12-04 DIAGNOSIS — E7800 Pure hypercholesterolemia, unspecified: Secondary | ICD-10-CM | POA: Diagnosis not present

## 2021-12-04 DIAGNOSIS — E785 Hyperlipidemia, unspecified: Secondary | ICD-10-CM

## 2021-12-04 DIAGNOSIS — E1122 Type 2 diabetes mellitus with diabetic chronic kidney disease: Secondary | ICD-10-CM | POA: Diagnosis not present

## 2021-12-04 NOTE — Patient Instructions (Signed)
Continue ASA and plavix. After three months ( 02/02/22) can stop plavix and continue ASA 81 mg daily LDL goal <70- continue atorvastatin 10 mg Diabetes HgbA1c goal <6.5 % BP goal <130/90- if consistently running high then call PCP Repeat CT angiogram at the next visit Monitor diet and try to exercise

## 2021-12-09 ENCOUNTER — Other Ambulatory Visit (HOSPITAL_COMMUNITY): Payer: Self-pay

## 2021-12-24 IMAGING — MR MR HEAD W/O CM
6 of 10 series · 26 of 48 positions shown · non-contrast
Comparison: Prior CT from earlier the same day.

CLINICAL DATA: Initial evaluation for neuro deficit, gait
instability.

EXAM:
MRI HEAD WITHOUT CONTRAST
TECHNIQUE: Multiplanar, multiecho pulse sequences of the brain and surrounding
structures were obtained without intravenous contrast.

[Series 2: DWI · axial · 3.0mm · 0.94mm/px · z∈[-107,+26]mm · 8 of 92 slices shown (1 of 2)]
[im 1/92]
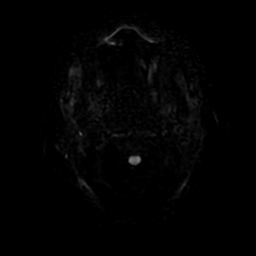
[im 11/92]
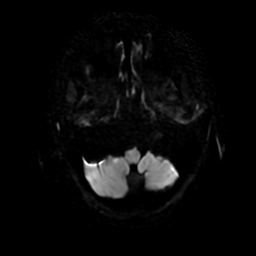
[im 31/92]
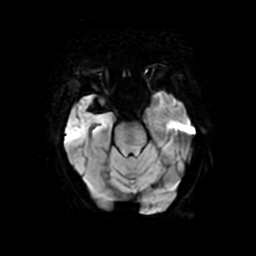
[im 41/92]
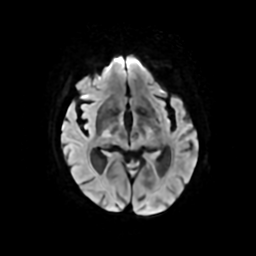
[im 51/92]
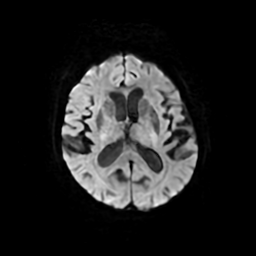
[im 61/92]
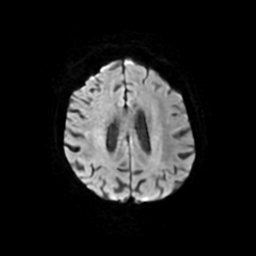
[im 81/92]
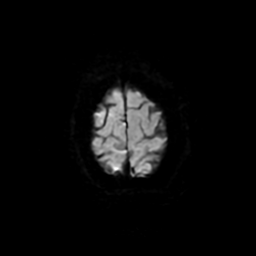
[im 92/92]
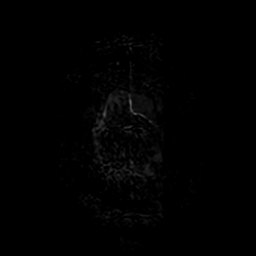

[Series 3: DWI · coronal · 4.0mm · 0.94mm/px · 7 of 72 slices shown (2 of 2)]
[im 1/72]
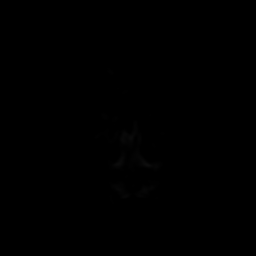
[im 12/72]
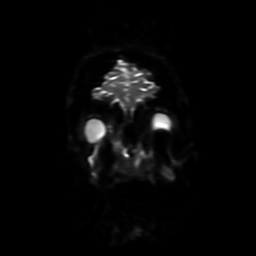
[im 24/72]
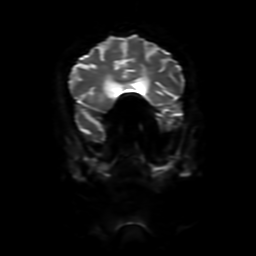
[im 36/72]
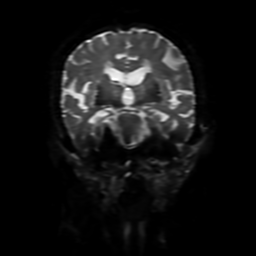
[im 48/72]
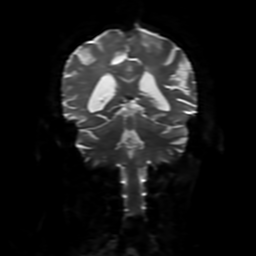
[im 60/72]
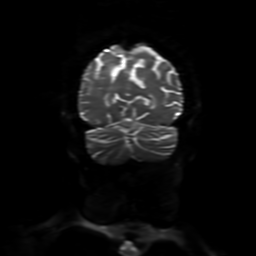
[im 72/72]
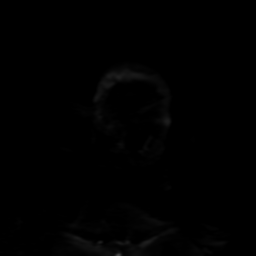

[Series 4: FLAIR · sagittal · 5.0mm · 0.23mm/px · 2 of 24 slices shown (1 of 2)]
[im 1/24]
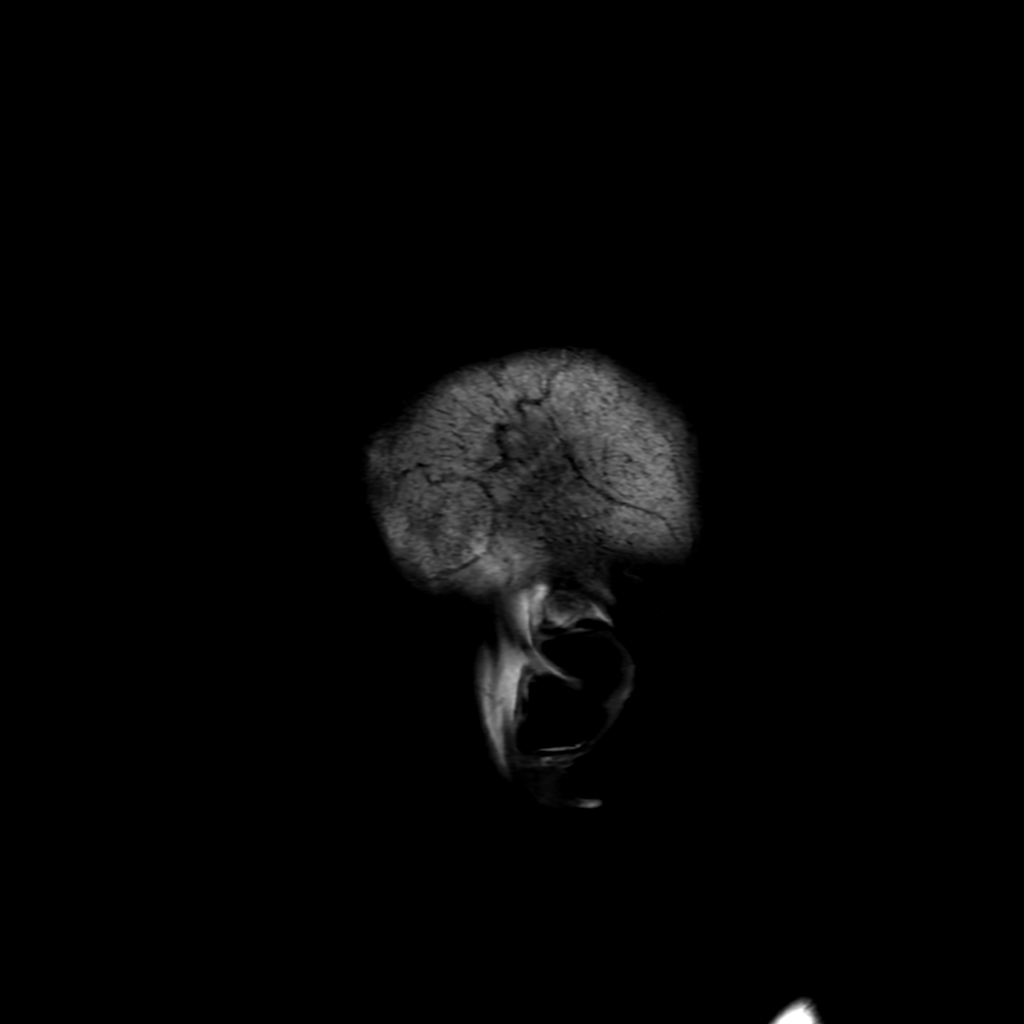
[im 24/24]
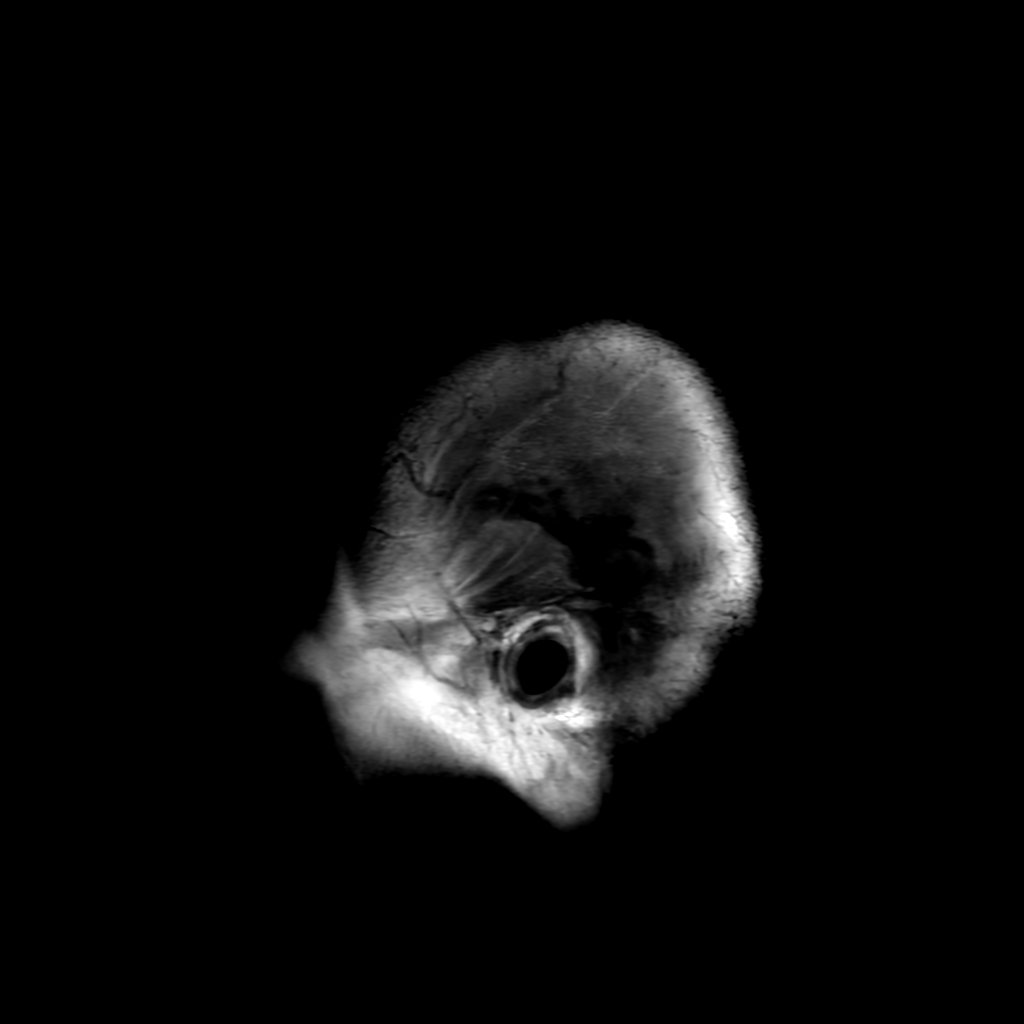

[Series 6: FLAIR · axial · 3.0mm · 0.45mm/px · z∈[-108,+23]mm · 2 of 23 slices shown (2 of 2)]
[im 1/23]
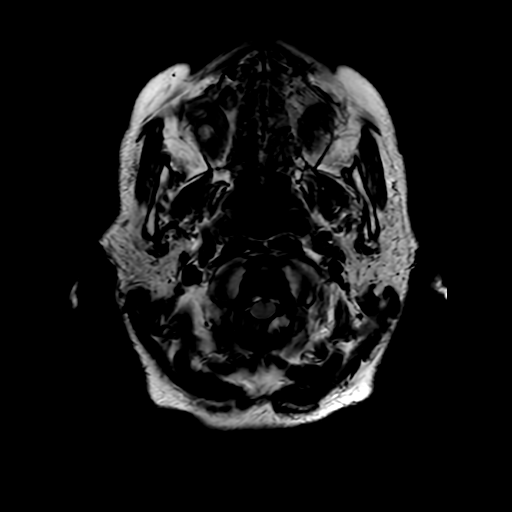
[im 23/23]
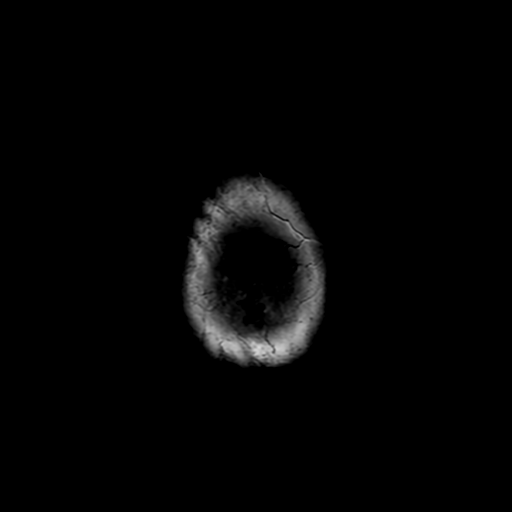

[Series 250: ADC · axial · 3.0mm · 0.94mm/px · z∈[-107,+26]mm · 4 of 46 slices shown (1 of 2)]
[im 1/46]
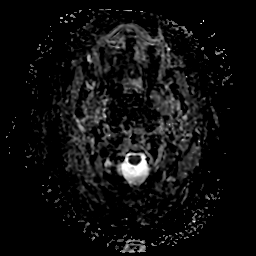
[im 16/46]
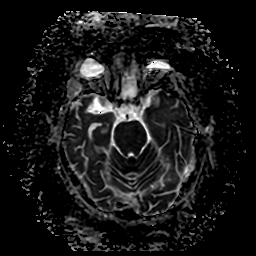
[im 31/46]
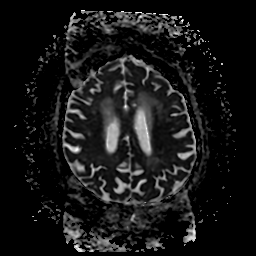
[im 46/46]
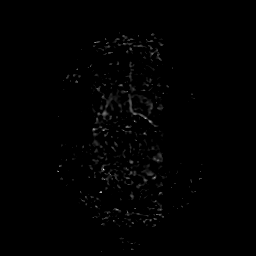

[Series 350: ADC · coronal · 4.0mm · 0.94mm/px · 3 of 35 slices shown (2 of 2)]
[im 1/35]
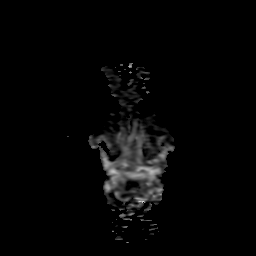
[im 18/35]
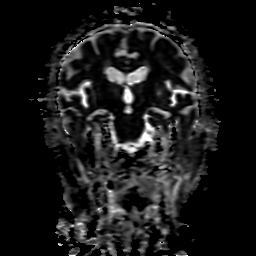
[im 35/35]
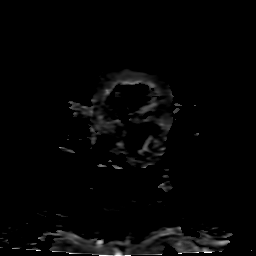

[26 of 48 positions shown; findings below may reference images not displayed]

FINDINGS: Brain: Generalized age-related cerebral atrophy. Patchy and
confluent T2/FLAIR hyperintensity involving the periventricular and
deep white matter both cerebral hemispheres as well as the thalami
and pons, most consistent with chronic microvascular ischemic
disease, moderate in nature.

No abnormal foci of restricted diffusion to suggest acute or
subacute ischemia. Gray-white matter differentiation maintained. No
encephalomalacia to suggest chronic cortical infarction. No acute
intracranial hemorrhage. Single punctate chronic microhemorrhage
noted within the left occipital lobe, of doubtful significance in
isolation.

No mass lesion, midline shift or mass effect. No hydrocephalus or
extra-axial fluid collection. Pituitary gland suprasellar region
normal. Midline structures intact.

Vascular: Major intracranial vascular flow voids are maintained.

Skull and upper cervical spine: Degenerative thickening noted about
the tectorial membrane without significant stenosis. Craniocervical
junction otherwise unremarkable. Degenerative disc bulging noted at
C3-4 with resultant mild spinal stenosis. Bone marrow signal
intensity within normal limits. No scalp soft tissue abnormality.

Sinuses/Orbits: Patient status post ocular lens replacement on the
right. Globes and orbital soft tissues demonstrate no acute finding.
Scattered mucosal thickening noted within the ethmoidal air cells
and maxillary sinuses, greater on the right. No mastoid effusion.
Inner ear structures grossly normal.

Other: None.
IMPRESSION: 1. No acute intracranial abnormality.
2. Generalized age-related cerebral atrophy with moderate chronic
microvascular ischemic disease.

## 2022-01-02 ENCOUNTER — Telehealth: Payer: Self-pay | Admitting: Cardiology

## 2022-01-02 NOTE — Telephone Encounter (Signed)
Pt advised her monitor results and will keep her 01/16/22 upcoming appt.

## 2022-01-02 NOTE — Telephone Encounter (Signed)
    Pt is returning call to get heart monitor result 

## 2022-01-14 DIAGNOSIS — U071 COVID-19: Secondary | ICD-10-CM | POA: Diagnosis not present

## 2022-01-16 ENCOUNTER — Ambulatory Visit: Payer: Medicare Other | Admitting: Cardiovascular Disease

## 2022-02-05 DIAGNOSIS — N2581 Secondary hyperparathyroidism of renal origin: Secondary | ICD-10-CM | POA: Diagnosis not present

## 2022-02-05 DIAGNOSIS — N1832 Chronic kidney disease, stage 3b: Secondary | ICD-10-CM | POA: Diagnosis not present

## 2022-02-05 DIAGNOSIS — I5032 Chronic diastolic (congestive) heart failure: Secondary | ICD-10-CM | POA: Diagnosis not present

## 2022-02-05 DIAGNOSIS — E1122 Type 2 diabetes mellitus with diabetic chronic kidney disease: Secondary | ICD-10-CM | POA: Diagnosis not present

## 2022-02-05 DIAGNOSIS — N1831 Chronic kidney disease, stage 3a: Secondary | ICD-10-CM | POA: Diagnosis not present

## 2022-02-05 DIAGNOSIS — R634 Abnormal weight loss: Secondary | ICD-10-CM | POA: Diagnosis not present

## 2022-02-05 DIAGNOSIS — I129 Hypertensive chronic kidney disease with stage 1 through stage 4 chronic kidney disease, or unspecified chronic kidney disease: Secondary | ICD-10-CM | POA: Diagnosis not present

## 2022-02-05 DIAGNOSIS — I10 Essential (primary) hypertension: Secondary | ICD-10-CM | POA: Diagnosis not present

## 2022-02-05 DIAGNOSIS — I130 Hypertensive heart and chronic kidney disease with heart failure and stage 1 through stage 4 chronic kidney disease, or unspecified chronic kidney disease: Secondary | ICD-10-CM | POA: Diagnosis not present

## 2022-02-05 DIAGNOSIS — E7800 Pure hypercholesterolemia, unspecified: Secondary | ICD-10-CM | POA: Diagnosis not present

## 2022-02-27 NOTE — Progress Notes (Signed)
Cardiology Office Note:   Date:  02/28/2022  NAME:  Amber Gonzales    MRN: [id number] DOB:  11-Jun-1930   PCP:  Clayborn Heron, MD  Cardiologist:  None  Electrophysiologist:  None   Referring MD: Clayborn Heron, MD   Chief Complaint  Patient presents with   Follow-up    2-3 months.    History of Present Illness:   Amber Gonzales is a 88 y.o. female with a hx of CHF, LBBB, CVA, HTN, CKD who presents for follow--up. Admitted 11/2021 for acute stroke 2/2 ICAD. Echo with EF 35-40% and no symptoms of CHF.  She reports she is doing well.  No symptoms of heart failure.  No chest pains or trouble breathing.  Blood pressure is not controlled.  152/98.  She reports it is high at home.  We discussed further titration of her medications.  She has no lower extremity edema.  She is not requiring Lasix.  Her stroke symptoms have resolved.  She overall is without complaints today.  No signs of heart failure.  Seems to be doing better.  Problem List Systolic HF -EF 35-40% 11/2021 2. LBBB 3. HTN 4. CVA -Severe L PICA stenosis (ICAD) 5. CKD IIIb 6. HLD -T chol 192, HDL 54. LDL 127, TG 53  Past Medical History: Past Medical History:  Diagnosis Date   Diabetes mellitus without complication (HCC)    Hypertension    Renal disorder     Past Surgical History: History reviewed. No pertinent surgical history.  Current Medications: Current Meds  Medication Sig   acetaminophen (TYLENOL) 325 MG tablet Take 2 tablets (650 mg total) by mouth every 4 (four) hours as needed for mild pain (or temp > 37.5 C (99.5 F)).   aspirin 81 MG EC tablet Take 1 tablet (81 mg total) by mouth daily. Swallow whole.   Cholecalciferol (VITAMIN D3 PO) Take 1 capsule by mouth daily.   clopidogrel (PLAVIX) 75 MG tablet Take 1 tablet (75 mg total) by mouth daily.   furosemide (LASIX) 20 MG tablet Take 1 tablet (20 mg total) by mouth daily as needed for fluid or edema. (Patient taking differently: Take 20 mg by mouth as  needed for fluid or edema.)   isosorbide mononitrate (IMDUR) 30 MG 24 hr tablet Take 0.5 tablets (15 mg total) by mouth daily.   rosuvastatin (CRESTOR) 10 MG tablet Take 1 tablet (10 mg total) by mouth daily.   [DISCONTINUED] carvedilol (COREG) 6.25 MG tablet Take 1 tablet (6.25 mg total) by mouth 2 (two) times daily with a meal.   [DISCONTINUED] hydrALAZINE (APRESOLINE) 10 MG tablet Take 1 tablet (10 mg total) by mouth every 8 (eight) hours.     Allergies:    Patient has no known allergies.   Social History: Social History   Socioeconomic History   Marital status: Married    Spouse name: Not on file   Number of children: Not on file   Years of education: Not on file   Highest education level: Not on file  Occupational History   Not on file  Tobacco Use   Smoking status: Never   Smokeless tobacco: Never  Substance and Sexual Activity   Alcohol use: Never   Drug use: Never   Sexual activity: Not on file  Other Topics Concern   Not on file  Social History Narrative   Not on file   Social Determinants of Health   Financial Resource Strain: Not on file  Food Insecurity:  Not on file  Transportation Needs: Not on file  Physical Activity: Not on file  Stress: Not on file  Social Connections: Not on file     Family History: The patient's family history is negative for Stroke.  ROS:   All other ROS reviewed and negative. Pertinent positives noted in the HPI.     EKGs/Labs/Other Studies Reviewed:   The following studies were personally reviewed by me today:  TTE 11/04/2021  1. Left ventricular ejection fraction, by estimation, is 35 to 40%. The  left ventricle has moderately decreased function. The left ventricle  demonstrates global hypokinesis. There is mild concentric left ventricular  hypertrophy. Left ventricular  diastolic parameters are indeterminate.   2. Right ventricular systolic function is normal. The right ventricular  size is normal. There is normal  pulmonary artery systolic pressure.   3. Left atrial size was mildly dilated.   4. A small pericardial effusion is present.   5. The mitral valve is normal in structure. Trivial mitral valve  regurgitation. No evidence of mitral stenosis.   6. The aortic valve is tricuspid. There is mild calcification of the  aortic valve. There is mild thickening of the aortic valve. Aortic valve  regurgitation is not visualized. Aortic valve sclerosis is present, with  no evidence of aortic valve stenosis.   7. The inferior vena cava is normal in size with <50% respiratory  variability, suggesting right atrial pressure of 8 mmHg.   Recent Labs: 11/05/2021: BUN 17; Creatinine, Ser 1.57; Hemoglobin 11.0; Magnesium 2.2; Platelets 183; Potassium 3.9; Sodium 139   Recent Lipid Panel    Component Value Date/Time   CHOL 192 11/04/2021 0449   TRIG 53 11/04/2021 0449   HDL 54 11/04/2021 0449   CHOLHDL 3.6 11/04/2021 0449   VLDL 11 11/04/2021 0449   LDLCALC 127 (H) 11/04/2021 0449    Physical Exam:   VS:  BP (!) 152/98 (BP Location: Left Arm, Patient Position: Sitting, Cuff Size: Normal)   Pulse 87   Ht 5\' 3"  (1.6 m)   Wt 114 lb (51.7 kg)   BMI 20.19 kg/m    Wt Readings from Last 3 Encounters:  02/28/22 114 lb (51.7 kg)  12/04/21 115 lb 12.8 oz (52.5 kg)  11/03/21 117 lb (53.1 kg)    General: Well nourished, well developed, in no acute distress Head: Atraumatic, normal size  Eyes: PEERLA, EOMI  Neck: Supple, no JVD Endocrine: No thryomegaly Cardiac: Normal S1, S2; RRR; no murmurs, rubs, or gallops Lungs: Clear to auscultation bilaterally, no wheezing, rhonchi or rales  Abd: Soft, nontender, no hepatomegaly  Ext: No edema, pulses 2+ Musculoskeletal: No deformities, BUE and BLE strength normal and equal Skin: Warm and dry, no rashes   Neuro: Alert and oriented to person, place, time, and situation, CNII-XII grossly intact, no focal deficits  Psych: Normal mood and affect   ASSESSMENT:   Amber Gonzales is a 88 y.o. female who presents for the following: 1. Chronic systolic heart failure (HCC)   2. Primary hypertension   3. Mixed hyperlipidemia     PLAN:   1. Chronic systolic heart failure (HCC) 2. Primary hypertension -Diagnosed with systolic heart failure earlier this year EF 35 to 40%.  Likely related to uncontrolled hypertension.  Given CKD stage IIIb not a candidate for invasive angiography.  She has no chest pain.  I would recommend medical therapy given her age and comorbidities.  We will increase her carvedilol to 12.5 mg twice daily.  Increase hydralazine to 25 mg 3 times daily.  Continue Imdur 15 mg daily.  She takes Lasix as needed.  Not requiring any Lasix.  She overall is without heart failure symptoms.  She does have left bundle branch block but QRS is not wide enough to merit CRT.  Overall suspect the etiology for heart failure is related to hypertension.  We could consider an ischemic evaluation if EF does not improve.  I would like to check an echo.  Not a candidate for ACE/ARB/ARNI/MRA given CKD stage IIIb.  She will see me back in 3 months.  3. Mixed hyperlipidemia -Continue Crestor given recent stroke.  Goal LDL cholesterol less than 70.  Stroke was related to intracranial atherosclerosis.  Disposition: Return in about 3 months (around 05/31/2022).  Medication Adjustments/Labs and Tests Ordered: Current medicines are reviewed at length with the patient today.  Concerns regarding medicines are outlined above.  Orders Placed This Encounter  Procedures   ECHOCARDIOGRAM COMPLETE   Meds ordered this encounter  Medications   carvedilol (COREG) 12.5 MG tablet    Sig: Take 1 tablet (12.5 mg total) by mouth 2 (two) times daily with a meal.    Dispense:  90 tablet    Refill:  3   hydrALAZINE (APRESOLINE) 25 MG tablet    Sig: Take 1 tablet (25 mg total) by mouth 3 (three) times daily.    Dispense:  360 tablet    Refill:  1    Patient Instructions  Medication  Instructions:  Increase Carvedilol to 12.5 mg twice daily  Increase Hydralazine to 25 mg three times daily   *If you need a refill on your cardiac medications before your next appointment, please call your pharmacy*   Testing/Procedures: Echocardiogram - Your physician has requested that you have an echocardiogram. Echocardiography is a painless test that uses sound waves to create images of your heart. It provides your doctor with information about the size and shape of your heart and how well your heart's chambers and valves are working. This procedure takes approximately one hour. There are no restrictions for this procedure.     Follow-Up: At [facility], you and your health needs are our priority.  As part of our continuing mission to provide you with exceptional heart care, we have created designated Provider Care Teams.  These Care Teams include your primary Cardiologist (physician) and Advanced Practice Providers (APPs -  Physician Assistants and Nurse Practitioners) who all work together to provide you with the care you need, when you need it.  We recommend signing up for the patient portal called "MyChart".  Sign up information is provided on this After Visit Summary.  MyChart is used to connect with patients for Virtual Visits (Telemedicine).  Patients are able to view lab/test results, encounter notes, upcoming appointments, etc.  Non-urgent messages can be sent to your provider as well.   To learn more about what you can do with MyChart, go to [email or web address].    Your next appointment:   3 month(s)  The format for your next appointment:   In Person  Provider:   Lennie Odor, MD             Time Spent with Patient: I have spent a total of 35 minutes with patient reviewing hospital notes, telemetry, EKGs, labs and examining the patient as well as establishing an assessment and plan that was discussed with the patient.  > 50% of time was spent in direct  patient care.  Signed,  Gerri Spore T. Flora Lipps, MD, [facility]  [facility]  [address] [place], [place] [address] [phone]  02/28/2022 9:27 AM

## 2022-02-28 ENCOUNTER — Encounter: Payer: Self-pay | Admitting: Cardiovascular Disease

## 2022-02-28 ENCOUNTER — Ambulatory Visit: Payer: Medicare Other | Admitting: Cardiovascular Disease

## 2022-02-28 VITALS — BP 152/98 | HR 87 | Ht 63.0 in | Wt 114.0 lb

## 2022-02-28 DIAGNOSIS — I5022 Chronic systolic (congestive) heart failure: Secondary | ICD-10-CM | POA: Diagnosis not present

## 2022-02-28 DIAGNOSIS — E782 Mixed hyperlipidemia: Secondary | ICD-10-CM

## 2022-02-28 DIAGNOSIS — I10 Essential (primary) hypertension: Secondary | ICD-10-CM

## 2022-02-28 MED ORDER — HYDRALAZINE HCL 25 MG PO TABS: 25.0000 mg | ORAL_TABLET | Freq: Three times a day (TID) | ORAL | 1 refills | Status: DC

## 2022-02-28 MED ORDER — CARVEDILOL 12.5 MG PO TABS: 12.5000 mg | ORAL_TABLET | Freq: Two times a day (BID) | ORAL | 3 refills | Status: DC

## 2022-02-28 NOTE — Patient Instructions (Signed)
Medication Instructions:  Increase Carvedilol to 12.5 mg twice daily  Increase Hydralazine to 25 mg three times daily   *If you need a refill on your cardiac medications before your next appointment, please call your pharmacy*   Testing/Procedures: Echocardiogram - Your physician has requested that you have an echocardiogram. Echocardiography is a painless test that uses sound waves to create images of your heart. It provides your doctor with information about the size and shape of your heart and how well your heart's chambers and valves are working. This procedure takes approximately one hour. There are no restrictions for this procedure.     Follow-Up: At [facility], you and your health needs are our priority.  As part of our continuing mission to provide you with exceptional heart care, we have created designated Provider Care Teams.  These Care Teams include your primary Cardiologist (physician) and Advanced Practice Providers (APPs -  Physician Assistants and Nurse Practitioners) who all work together to provide you with the care you need, when you need it.  We recommend signing up for the patient portal called "MyChart".  Sign up information is provided on this After Visit Summary.  MyChart is used to connect with patients for Virtual Visits (Telemedicine).  Patients are able to view lab/test results, encounter notes, upcoming appointments, etc.  Non-urgent messages can be sent to your provider as well.   To learn more about what you can do with MyChart, go to [email or web address].    Your next appointment:   3 month(s)  The format for your next appointment:   In Person  Provider:   Lennie Odor, MD

## 2022-03-05 ENCOUNTER — Ambulatory Visit: Payer: Self-pay

## 2022-03-05 ENCOUNTER — Telehealth: Payer: Self-pay | Admitting: Cardiovascular Disease

## 2022-03-05 MED ORDER — CLOPIDOGREL BISULFATE 75 MG PO TABS: 75.0000 mg | ORAL_TABLET | Freq: Every day | ORAL | 3 refills | Status: DC

## 2022-03-05 NOTE — Telephone Encounter (Signed)
*  STAT* If patient is at the pharmacy, call can be transferred to refill team.   1. Which medications need to be refilled? (please list name of each medication and dose if known)   clopidogrel (PLAVIX) 75 MG tablet  2. Which pharmacy/location (including street and city if local pharmacy) is medication to be sent to?  [id number] - , [place] - [address]  3. Do they need a 30 day or 90 day supply?  30 days  Daughter called stating the patient is completely out of this medication and has no refills left on the prescription.

## 2022-03-05 NOTE — Patient Outreach (Signed)
Care Coordination   Initial Visit Note   03/05/2022 Name: Amber Gonzales MRN: [id number] DOB: [birth year]  Amber Gonzales is a 88 y.o. year old female who sees Rankins, Fanny Dance, MD for primary care. I spoke with  Jake Seats by phone today.  What matters to the patients health and wellness today?  Health Maintenance    Goals Addressed             This Visit's Progress    Health Maintenance       Care Coordination Interventions: Reviewed plan for disease management. Reports adhering to plan. Reviewed medications. Denies concerns r/t medication management or prescription cost. Reports family is assisting with transportation. and attending medical appointments as scheduled. Reviewed pending appointments. Patient will complete an echocardiogram on 03/12/22. AWV due. Patient reports she is scheduled to complete in September.        SDOH assessments and interventions completed:  Yes  SDOH Interventions Today    Flowsheet Row Most Recent Value  SDOH Interventions   Food Insecurity Interventions Intervention Not Indicated  Transportation Interventions Intervention Not Indicated  [Reports son and daughter currently assisting]        Care Coordination Interventions Activated:  Yes  Care Coordination Interventions:  Yes, provided   Follow up plan:  Patient will call or contact clinic for assistance as needed   Encounter Outcome:  Pt. Visit Completed   [facility] Care Management [phone]

## 2022-03-12 ENCOUNTER — Other Ambulatory Visit (HOSPITAL_COMMUNITY): Payer: Medicare Other

## 2022-03-13 ENCOUNTER — Ambulatory Visit (HOSPITAL_COMMUNITY): Payer: Medicare Other | Attending: Cardiovascular Disease

## 2022-03-13 DIAGNOSIS — I5022 Chronic systolic (congestive) heart failure: Secondary | ICD-10-CM | POA: Insufficient documentation

## 2022-03-13 LAB — ECHOCARDIOGRAM COMPLETE
Area-P 1/2: 2.37 cm2
S' Lateral: 3 cm

## 2022-03-19 DIAGNOSIS — E7800 Pure hypercholesterolemia, unspecified: Secondary | ICD-10-CM | POA: Diagnosis not present

## 2022-03-19 DIAGNOSIS — N183 Chronic kidney disease, stage 3 unspecified: Secondary | ICD-10-CM | POA: Diagnosis not present

## 2022-03-19 DIAGNOSIS — E1122 Type 2 diabetes mellitus with diabetic chronic kidney disease: Secondary | ICD-10-CM | POA: Diagnosis not present

## 2022-03-19 DIAGNOSIS — Z8673 Personal history of transient ischemic attack (TIA), and cerebral infarction without residual deficits: Secondary | ICD-10-CM | POA: Diagnosis not present

## 2022-03-19 DIAGNOSIS — I5032 Chronic diastolic (congestive) heart failure: Secondary | ICD-10-CM | POA: Diagnosis not present

## 2022-03-19 DIAGNOSIS — I10 Essential (primary) hypertension: Secondary | ICD-10-CM | POA: Diagnosis not present

## 2022-03-19 DIAGNOSIS — I130 Hypertensive heart and chronic kidney disease with heart failure and stage 1 through stage 4 chronic kidney disease, or unspecified chronic kidney disease: Secondary | ICD-10-CM | POA: Diagnosis not present

## 2022-03-20 DIAGNOSIS — N1831 Chronic kidney disease, stage 3a: Secondary | ICD-10-CM | POA: Diagnosis not present

## 2022-03-20 DIAGNOSIS — E1122 Type 2 diabetes mellitus with diabetic chronic kidney disease: Secondary | ICD-10-CM | POA: Diagnosis not present

## 2022-03-20 DIAGNOSIS — I130 Hypertensive heart and chronic kidney disease with heart failure and stage 1 through stage 4 chronic kidney disease, or unspecified chronic kidney disease: Secondary | ICD-10-CM | POA: Diagnosis not present

## 2022-03-20 DIAGNOSIS — E7800 Pure hypercholesterolemia, unspecified: Secondary | ICD-10-CM | POA: Diagnosis not present

## 2022-03-20 DIAGNOSIS — I10 Essential (primary) hypertension: Secondary | ICD-10-CM | POA: Diagnosis not present

## 2022-03-20 DIAGNOSIS — I5032 Chronic diastolic (congestive) heart failure: Secondary | ICD-10-CM | POA: Diagnosis not present

## 2022-04-10 DIAGNOSIS — I10 Essential (primary) hypertension: Secondary | ICD-10-CM | POA: Diagnosis not present

## 2022-05-27 DIAGNOSIS — I5032 Chronic diastolic (congestive) heart failure: Secondary | ICD-10-CM | POA: Diagnosis not present

## 2022-05-27 DIAGNOSIS — I130 Hypertensive heart and chronic kidney disease with heart failure and stage 1 through stage 4 chronic kidney disease, or unspecified chronic kidney disease: Secondary | ICD-10-CM | POA: Diagnosis not present

## 2022-05-27 DIAGNOSIS — E7800 Pure hypercholesterolemia, unspecified: Secondary | ICD-10-CM | POA: Diagnosis not present

## 2022-05-27 DIAGNOSIS — N1831 Chronic kidney disease, stage 3a: Secondary | ICD-10-CM | POA: Diagnosis not present

## 2022-05-27 DIAGNOSIS — E1122 Type 2 diabetes mellitus with diabetic chronic kidney disease: Secondary | ICD-10-CM | POA: Diagnosis not present

## 2022-05-27 DIAGNOSIS — I10 Essential (primary) hypertension: Secondary | ICD-10-CM | POA: Diagnosis not present

## 2022-06-05 NOTE — Progress Notes (Signed)
Cardiology Office Note:   Date:  06/09/2022  NAME:  Amber Gonzales    MRN: [id number] DOB:  June 05, 1930   PCP:  Aretta Nip, MD  Cardiologist:  None  Electrophysiologist:  None   Referring MD: Aretta Nip, MD   Chief Complaint  Patient presents with   Follow-up        History of Present Illness:   Amber Gonzales is a 88 y.o. female with a hx of chronic systolic heart failure, hypertension, stroke who presents for follow-up.  Ejection fraction has improved to 40-45%.  She presents with her daughter.  No symptoms of congestive heart failure.  Overall doing well.  She is taking Lasix once a week.  I informed her she can take this as needed.  She denies any chest pain or trouble breathing.  She reports pain in her left flank.  This occurs with standing.  No symptoms of congestive heart failure.  Overall doing well.  Blood pressure still slightly elevated.  We discussed increasing her hydralazine.  She also has been taking Lasix more than she needs to.  I informed her that she should only take this if needed.  I instructed her to take this if she has lower extremity edema.  She has had none.   Problem List Systolic HF -EF [id number] -EF 40-45% 03/13/2022 2. LBBB -QRS 134 ms 3. HTN 4. CVA -Severe L PICA stenosis ([facility]) 5. CKD IIIb 6. HLD -T chol 192, HDL 54. LDL 127, TG 53  Past Medical History: Past Medical History:  Diagnosis Date   Diabetes mellitus without complication ([place])    Hypertension    Renal disorder     Past Surgical History: History reviewed. No pertinent surgical history.  Current Medications: Current Meds  Medication Sig   acetaminophen (TYLENOL) 325 MG tablet Take 2 tablets (650 mg total) by mouth every 4 (four) hours as needed for mild pain (or temp > 37.5 C (99.5 F)).   aspirin 81 MG EC tablet Take 1 tablet (81 mg total) by mouth daily. Swallow whole.   carvedilol (COREG) 12.5 MG tablet Take 1 tablet (12.5 mg total) by mouth 2 (two) times  daily with a meal.   Cholecalciferol (VITAMIN D3 PO) Take 1 capsule by mouth daily.   clopidogrel (PLAVIX) 75 MG tablet Take 1 tablet (75 mg total) by mouth daily.   furosemide (LASIX) 20 MG tablet Take 1 tablet (20 mg total) by mouth daily as needed for fluid or edema. (Patient taking differently: Take 20 mg by mouth as needed for fluid or edema.)   isosorbide mononitrate (IMDUR) 30 MG 24 hr tablet Take 0.5 tablets (15 mg total) by mouth daily.   rosuvastatin (CRESTOR) 10 MG tablet Take 1 tablet (10 mg total) by mouth daily.   [DISCONTINUED] hydrALAZINE (APRESOLINE) 25 MG tablet Take 1 tablet (25 mg total) by mouth 3 (three) times daily.     Allergies:    Patient has no known allergies.   Social History: Social History   Socioeconomic History   Marital status: Married    Spouse name: Not on file   Number of children: Not on file   Years of education: Not on file   Highest education level: Not on file  Occupational History   Not on file  Tobacco Use   Smoking status: Never   Smokeless tobacco: Never  Substance and Sexual Activity   Alcohol use: Never   Drug use: Never   Sexual activity: Not  on file  Other Topics Concern   Not on file  Social History Narrative   Not on file   Social Determinants of Health   Financial Resource Strain: Not on file  Food Insecurity: No Food Insecurity (03/05/2022)   Hunger Vital Sign    Worried About Running Out of Food in the Last Year: Never true    Ran Out of Food in the Last Year: Never true  Transportation Needs: No Transportation Needs (03/05/2022)   PRAPARE - Administrator, Civil Service (Medical): No    Lack of Transportation (Non-Medical): No  Physical Activity: Not on file  Stress: Not on file  Social Connections: Not on file     Family History: The patient's family history is negative for Stroke.  ROS:   All other ROS reviewed and negative. Pertinent positives noted in the HPI.     EKGs/Labs/Other Studies  Reviewed:   The following studies were personally reviewed by me today:  TTE 03/13/2022  1. Left ventricular ejection fraction, by estimation, is 40 to 45%. Left  ventricular ejection fraction by 3D volume is 53 %. The left ventricle has  mildly decreased function. The left ventricle demonstrates global  hypokinesis. Left ventricular diastolic   parameters are consistent with Grade I diastolic dysfunction (impaired  relaxation).   2. Right ventricular systolic function is normal. The right ventricular  size is normal. There is normal pulmonary artery systolic pressure. The  estimated right ventricular systolic pressure is 26.8 mmHg.   3. The mitral valve is normal in structure. No evidence of mitral valve  regurgitation.   4. The aortic valve is tricuspid. Aortic valve regurgitation is not  visualized. No aortic stenosis is present.   5. The inferior vena cava is normal in size with greater than 50%  respiratory variability, suggesting right atrial pressure of 3 mmHg.   Recent Labs: 11/05/2021: BUN 17; Creatinine, Ser 1.57; Hemoglobin 11.0; Magnesium 2.2; Platelets 183; Potassium 3.9; Sodium 139   Recent Lipid Panel    Component Value Date/Time   CHOL 192 11/04/2021 0449   TRIG 53 11/04/2021 0449   HDL 54 11/04/2021 0449   CHOLHDL 3.6 11/04/2021 0449   VLDL 11 11/04/2021 0449   LDLCALC 127 (H) 11/04/2021 0449    Physical Exam:   VS:  BP (!) 148/68   Pulse 77   Ht 5\' 6"  (1.676 m)   Wt 112 lb 12.8 oz (51.2 kg)   SpO2 99%   BMI 18.21 kg/m    Wt Readings from Last 3 Encounters:  06/09/22 112 lb 12.8 oz (51.2 kg)  02/28/22 114 lb (51.7 kg)  12/04/21 115 lb 12.8 oz (52.5 kg)    General: Well nourished, well developed, in no acute distress Head: Atraumatic, normal size  Eyes: PEERLA, EOMI  Neck: Supple, no JVD Endocrine: No thryomegaly Cardiac: Normal S1, S2; RRR; no murmurs, rubs, or gallops Lungs: Clear to auscultation bilaterally, no wheezing, rhonchi or rales  Abd:  Soft, nontender, no hepatomegaly  Ext: No edema, pulses 2+ Musculoskeletal: No deformities, BUE and BLE strength normal and equal Skin: Warm and dry, no rashes   Neuro: Alert and oriented to person, place, time, and situation, CNII-XII grossly intact, no focal deficits  Psych: Normal mood and affect   ASSESSMENT:   FUJIKO BAUDOIN is a 88 y.o. female who presents for the following: 1. Chronic systolic heart failure (HCC)   2. LBBB (left bundle branch block)   3. Primary hypertension  4. Mixed hyperlipidemia     PLAN:   1. Chronic systolic heart failure (HCC) 2. LBBB (left bundle branch block) 3. Primary hypertension -Diagnosed with systolic heart failure in the setting of uncontrolled hypertension in May 2023.  This was at the same time she was diagnosed with a left PICA stroke.  She was treated medically.  Her ejection fraction has improved to 40 to 45%. -Etiology is likely related to hypertension.  Continue Coreg 12.5 mg twice daily.  Increase hydralazine to 50 mg 3 times daily.  Continue Imdur 15 mg daily.  Further titration of medical therapy as we are able.  She should take Lasix as needed. -She describes left flank pain upon standing.  Not tender to palpation.  Suspect this could be musculoskeletal.  Does not appear to be cardiac.  No abdominal bruit.  Recommended follow-up with primary care physician.  4. Mixed hyperlipidemia -Status post stroke due to intracranial atherosclerosis.  Continue aspirin and statin therapy.  She is also on Plavix.  This is managed by neurology.  Continue statin therapy.  LDL needs to be rechecked at some point.     Disposition: Return in about 1 year (around 06/10/2023).  Medication Adjustments/Labs and Tests Ordered: Current medicines are reviewed at length with the patient today.  Concerns regarding medicines are outlined above.  No orders of the defined types were placed in this encounter.  Meds ordered this encounter  Medications   hydrALAZINE  (APRESOLINE) 50 MG tablet    Sig: Take 1 tablet (50 mg total) by mouth 3 (three) times daily.    Dispense:  360 tablet    Refill:  1    Patient Instructions  Medication Instructions:  Increase Hydralazine to 50 three times daily  Take Lasix as needed  *If you need a refill on your cardiac medications before your next appointment, please call your pharmacy*  Follow-Up: At [facility], you and your health needs are our priority.  As part of our continuing mission to provide you with exceptional heart care, we have created designated Provider Care Teams.  These Care Teams include your primary Cardiologist (physician) and Advanced Practice Providers (APPs -  Physician Assistants and Nurse Practitioners) who all work together to provide you with the care you need, when you need it.  We recommend signing up for the patient portal called "MyChart".  Sign up information is provided on this After Visit Summary.  MyChart is used to connect with patients for Virtual Visits (Telemedicine).  Patients are able to view lab/test results, encounter notes, upcoming appointments, etc.  Non-urgent messages can be sent to your provider as well.   To learn more about what you can do with MyChart, go to [email or web address].    Your next appointment:   12 month(s)  The format for your next appointment:   In Person  Provider:   Lennie Odor, MD           Time Spent with Patient: I have spent a total of 35 minutes with patient reviewing hospital notes, telemetry, EKGs, labs and examining the patient as well as establishing an assessment and plan that was discussed with the patient.  > 50% of time was spent in direct patient care.  Signed, Amber Gonzales. Flora Lipps, MD, [facility]  [facility]  [address] [place], [place] [address] [phone]  06/09/2022 9:50 AM

## 2022-06-09 ENCOUNTER — Encounter: Payer: Self-pay | Admitting: Cardiovascular Disease

## 2022-06-09 ENCOUNTER — Ambulatory Visit: Payer: Medicare Other | Attending: Cardiovascular Disease | Admitting: Cardiovascular Disease

## 2022-06-09 VITALS — BP 148/68 | HR 77 | Ht 66.0 in | Wt 112.8 lb

## 2022-06-09 DIAGNOSIS — E782 Mixed hyperlipidemia: Secondary | ICD-10-CM | POA: Diagnosis not present

## 2022-06-09 DIAGNOSIS — I10 Essential (primary) hypertension: Secondary | ICD-10-CM | POA: Diagnosis not present

## 2022-06-09 DIAGNOSIS — I5022 Chronic systolic (congestive) heart failure: Secondary | ICD-10-CM

## 2022-06-09 DIAGNOSIS — I447 Left bundle-branch block, unspecified: Secondary | ICD-10-CM

## 2022-06-09 MED ORDER — HYDRALAZINE HCL 50 MG PO TABS: 50.0000 mg | ORAL_TABLET | Freq: Three times a day (TID) | ORAL | 1 refills | Status: DC

## 2022-06-09 NOTE — Patient Instructions (Signed)
Medication Instructions:  Increase Hydralazine to 50 three times daily  Take Lasix as needed  *If you need a refill on your cardiac medications before your next appointment, please call your pharmacy*  Follow-Up: At [facility], you and your health needs are our priority.  As part of our continuing mission to provide you with exceptional heart care, we have created designated Provider Care Teams.  These Care Teams include your primary Cardiologist (physician) and Advanced Practice Providers (APPs -  Physician Assistants and Nurse Practitioners) who all work together to provide you with the care you need, when you need it.  We recommend signing up for the patient portal called "MyChart".  Sign up information is provided on this After Visit Summary.  MyChart is used to connect with patients for Virtual Visits (Telemedicine).  Patients are able to view lab/test results, encounter notes, upcoming appointments, etc.  Non-urgent messages can be sent to your provider as well.   To learn more about what you can do with MyChart, go to [email or web address].    Your next appointment:   12 month(s)  The format for your next appointment:   In Person  Provider:   Lennie Odor, MD

## 2022-06-19 ENCOUNTER — Ambulatory Visit: Payer: Medicare Other | Admitting: Adult Health

## 2022-06-19 ENCOUNTER — Telehealth: Payer: Self-pay | Admitting: Adult Health

## 2022-06-19 ENCOUNTER — Encounter: Payer: Self-pay | Admitting: Adult Health

## 2022-06-19 VITALS — BP 184/76 | HR 56 | Ht 62.0 in | Wt 113.2 lb

## 2022-06-19 DIAGNOSIS — R9389 Abnormal findings on diagnostic imaging of other specified body structures: Secondary | ICD-10-CM

## 2022-06-19 DIAGNOSIS — I63532 Cerebral infarction due to unspecified occlusion or stenosis of left posterior cerebral artery: Secondary | ICD-10-CM

## 2022-06-19 NOTE — Progress Notes (Signed)
PATIENT: Amber Gonzales DOB: September 19, 1929  REASON FOR VISIT: follow up HISTORY FROM: patient PRIMARY NEUROLOGIST: Dr. Pearlean Brownie   Chief Complaint  Patient presents with   Follow-up    Rm 6, daughter, Rosey Bath. Has been doing ok,  Bp elevated this am manually. took meds right before she left.  Need prescription of statin per pcp.       HISTORY OF PRESENT ILLNESS:  06/19/22 BARABARA Gonzales is a 88 y.o. female with a history of left TSA large infarct secondary to intracranial stenosis versus embolic source. Returns today for follow-up.  Overall patient reports that she has been doing well.  Denies any additional strokelike symptoms.  Remains on aspirin daily.  She did do a 30-day cardiac monitor that was normal.  Has been having regular follow-ups with her PCP to manage risk factors.  Returns today for evaluation.     HISTORY (copied from Dr. Roda Shutters notes):   Presented to ED  on 11/03/21 with LLE weakness and vertigo that started a week prior.  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 Severe stenosis at the origin of the left SCA, moderate stenosis in the bilateral cavernous and supraclinoid ICA, severe narrowing in the mid right A2, moderate narrowing in the more distal right ACA moderate to severe narrowing in the distal right M1, moderate narrowing of the left V4, and multifocal moderate and severe narrowing in the left P2 segment, with poor perfusion of the left P3 branches. 3 mm outpouching at the right MCA bifurcation, concerning for small aneurysm. 50% stenosis in the proximal left ICA. Severe stenosis at the origin of the left vertebral artery and proximal left V1 segment. MRI  Large inferior left cerebellar acute infarct. Associated edema with partial effacement the  inferior left fourth ventricle and left foramen of Luschka. No hydrocephalus at this time 2D Echo EF 35-40%, LV and moderately decreased function Bilateral lower extremity duplex-no DVT LDL 127 HgbA1c 5.6 VTE prophylaxis -Lovenox No antithrombotic prior to admission, now on aspirin 81 mg daily and clopidogrel 75 mg daily for 3 months and then ASA alone given left PICA occlusion  Today 12/03/21,   Reports symptoms are better. Dizziness is better, weakness in legs have improved occasionally feels weaker in left foot. Lives at home with son. Daughter checks on her daily. Good appetite. BP high today but hasn't taken her BP meds yet. Checks her BP at home. Currently has cardiac monitor. FU scheduled with cardiology 7/13. No family or personal History of cerebral aneursym or Dissection. not a  Smoker. Walks 1 mile each week.   REVIEW OF SYSTEMS: Out of a complete 14 system review of symptoms, the patient complains only of the following symptoms, and all other reviewed systems are negative.  ALLERGIES: No Known Allergies  HOME MEDICATIONS: Outpatient Medications Prior to Visit  Medication Sig Dispense Refill   acetaminophen (TYLENOL) 325 MG tablet Take 2 tablets (650 mg total) by mouth every 4 (four) hours as needed for mild pain (or temp > 37.5 C (99.5 F)).     aspirin 81 MG EC tablet Take 1 tablet (81 mg total) by mouth daily. Swallow whole. 30 tablet 11   carvedilol (COREG) 12.5 MG tablet Take 1 tablet (12.5 mg total) by mouth 2 (two) times daily with a  meal. 90 tablet 3   Cholecalciferol (VITAMIN D3 PO) Take 1 capsule by mouth daily.     furosemide (LASIX) 20 MG tablet Take 1 tablet (20 mg total) by mouth daily as needed for fluid or edema. (Patient taking differently: Take 20 mg by mouth as needed for fluid or edema.) 30 tablet 1   hydrALAZINE (APRESOLINE) 50 MG tablet Take 1 tablet (50 mg total) by mouth 3 (three) times daily. 360 tablet 1   isosorbide mononitrate (IMDUR) 30 MG 24 hr  tablet Take 0.5 tablets (15 mg total) by mouth daily. 30 tablet 1   rosuvastatin (CRESTOR) 10 MG tablet Take 1 tablet (10 mg total) by mouth daily. (Patient not taking: Reported on 06/19/2022) 30 tablet 1   clopidogrel (PLAVIX) 75 MG tablet Take 1 tablet (75 mg total) by mouth daily. 90 tablet 3   No facility-administered medications prior to visit.    PAST MEDICAL HISTORY: Past Medical History:  Diagnosis Date   Diabetes mellitus without complication (HCC)    Hypertension    Renal disorder     PAST SURGICAL HISTORY: History reviewed. No pertinent surgical history.  FAMILY HISTORY: Family History  Problem Relation Age of Onset   Stroke Neg Hx     SOCIAL HISTORY: Social History   Socioeconomic History   Marital status: Married    Spouse name: Not on file   Number of children: Not on file   Years of education: Not on file   Highest education level: Not on file  Occupational History   Not on file  Tobacco Use   Smoking status: Never   Smokeless tobacco: Never  Substance and Sexual Activity   Alcohol use: Never   Drug use: Never   Sexual activity: Not on file  Other Topics Concern   Not on file  Social History Narrative   Not on file   Social Determinants of Health   Financial Resource Strain: Not on file  Food Insecurity: No Food Insecurity (03/05/2022)   Hunger Vital Sign    Worried About Running Out of Food in the Last Year: Never true    Ran Out of Food in the Last Year: Never true  Transportation Needs: No Transportation Needs (03/05/2022)   PRAPARE - Administrator, Civil Service (Medical): No    Lack of Transportation (Non-Medical): No  Physical Activity: Not on file  Stress: Not on file  Social Connections: Not on file  Intimate Partner Violence: Not on file      PHYSICAL EXAM  Vitals:   06/19/22 1015  BP: (!) 184/76  Pulse: (!) 56  SpO2: 97%  Weight: 113 lb 3.2 oz (51.3 kg)  Height: 5\' 2"  (1.575 m)   Body mass index is 20.7  kg/m.  Generalized: Well developed, in no acute distress   Neurological examination  Mentation: Alert oriented to time, place, history taking. Follows all commands speech and language fluent Cranial nerve II-XII: Pupils were equal round reactive to light. Extraocular movements were full, visual field were full on confrontational test. Facial sensation and strength were normal.  Head turning and shoulder shrug  were normal and symmetric. Motor: The motor testing reveals 5 over 5 strength of all 4 extremities with exception slightly weaker in left ankle.Good symmetric motor tone is noted throughout.  Sensory: Sensory testing is intact to soft touch on all 4 extremities. No evidence of extinction is noted.  Coordination: Cerebellar testing reveals good finger-nose-finger and heel-to-shin bilaterally.    DIAGNOSTIC DATA (  LABS, IMAGING, TESTING) - I reviewed patient records, labs, notes, testing and imaging myself where available.  Lab Results  Component Value Date   WBC 4.2 11/05/2021   HGB 11.0 (L) 11/05/2021   HCT 32.5 (L) 11/05/2021   MCV 100.3 (H) 11/05/2021   PLT 183 11/05/2021      Component Value Date/Time   NA 139 11/05/2021 0346   K 3.9 11/05/2021 0346   CL 105 11/05/2021 0346   CO2 27 11/05/2021 0346   GLUCOSE 89 11/05/2021 0346   BUN 17 11/05/2021 0346   CREATININE 1.57 (H) 11/05/2021 0346   CALCIUM 8.5 (L) 11/05/2021 0346   PROT 7.1 12/22/2020 0957   ALBUMIN 3.5 12/22/2020 0957   AST 20 12/22/2020 0957   ALT 13 12/22/2020 0957   ALKPHOS 124 12/22/2020 0957   BILITOT 1.0 12/22/2020 0957   GFRNONAA 31 (L) 11/05/2021 0346   GFRAA 37 05/07/2009 0000   Lab Results  Component Value Date   CHOL 192 11/04/2021   HDL 54 11/04/2021   LDLCALC 127 (H) 11/04/2021   TRIG 53 11/04/2021   CHOLHDL 3.6 11/04/2021   Lab Results  Component Value Date   HGBA1C 5.2 11/04/2021   Lab Results  Component Value Date   TSH 1.276 10/09/2009      ASSESSMENT AND PLAN 88 y.o.  year old female  has a past medical history of Diabetes mellitus without complication (HCC), Hypertension, and Renal disorder. here with:  Stroke:  left PICA large infarct likely secondary intracranial stenosis vs embolic source  Continue aspirin 81 mg daily LDL goal <70- continue atorvastatin 10 mg HgbA1c goal <6.5 % BP goal <130/90- BP is high today.  Reports that she just took her medication prior to coming for the appointment.  Encouraged her to check her blood pressure at home.  If it is running high she should make her PCP aware Repeat CT angiogram head and neck with and without contrast Monitor diet and encouraged exercise  FU in 6 months or sooner if needed.   Butch Penny, MSN, NP-C 06/19/2022, 10:48 AM [facility] [address] [place], [place] [address] [phone]

## 2022-06-19 NOTE — Telephone Encounter (Signed)
[employer] medicare NPR sent to GI [phone] 

## 2022-06-23 ENCOUNTER — Other Ambulatory Visit: Payer: Self-pay | Admitting: Cardiovascular Disease

## 2022-07-16 DIAGNOSIS — I10 Essential (primary) hypertension: Secondary | ICD-10-CM | POA: Diagnosis not present

## 2022-07-28 DIAGNOSIS — N1832 Chronic kidney disease, stage 3b: Secondary | ICD-10-CM | POA: Diagnosis not present

## 2022-08-05 DIAGNOSIS — I129 Hypertensive chronic kidney disease with stage 1 through stage 4 chronic kidney disease, or unspecified chronic kidney disease: Secondary | ICD-10-CM | POA: Diagnosis not present

## 2022-08-05 DIAGNOSIS — N1832 Chronic kidney disease, stage 3b: Secondary | ICD-10-CM | POA: Diagnosis not present

## 2022-08-05 DIAGNOSIS — E872 Acidosis, unspecified: Secondary | ICD-10-CM | POA: Diagnosis not present

## 2022-08-05 DIAGNOSIS — N2581 Secondary hyperparathyroidism of renal origin: Secondary | ICD-10-CM | POA: Diagnosis not present

## 2022-08-13 DIAGNOSIS — S60450A Superficial foreign body of right index finger, initial encounter: Secondary | ICD-10-CM | POA: Diagnosis not present

## 2022-09-02 DIAGNOSIS — N1831 Chronic kidney disease, stage 3a: Secondary | ICD-10-CM | POA: Diagnosis not present

## 2022-09-02 DIAGNOSIS — E7800 Pure hypercholesterolemia, unspecified: Secondary | ICD-10-CM | POA: Diagnosis not present

## 2022-09-02 DIAGNOSIS — E1122 Type 2 diabetes mellitus with diabetic chronic kidney disease: Secondary | ICD-10-CM | POA: Diagnosis not present

## 2022-09-02 DIAGNOSIS — I130 Hypertensive heart and chronic kidney disease with heart failure and stage 1 through stage 4 chronic kidney disease, or unspecified chronic kidney disease: Secondary | ICD-10-CM | POA: Diagnosis not present

## 2022-09-02 DIAGNOSIS — I10 Essential (primary) hypertension: Secondary | ICD-10-CM | POA: Diagnosis not present

## 2022-09-02 DIAGNOSIS — I5032 Chronic diastolic (congestive) heart failure: Secondary | ICD-10-CM | POA: Diagnosis not present

## 2022-12-06 DIAGNOSIS — H43393 Other vitreous opacities, bilateral: Secondary | ICD-10-CM | POA: Diagnosis not present

## 2022-12-06 DIAGNOSIS — E119 Type 2 diabetes mellitus without complications: Secondary | ICD-10-CM | POA: Diagnosis not present

## 2023-02-17 DIAGNOSIS — E46 Unspecified protein-calorie malnutrition: Secondary | ICD-10-CM | POA: Diagnosis not present

## 2023-02-17 DIAGNOSIS — I130 Hypertensive heart and chronic kidney disease with heart failure and stage 1 through stage 4 chronic kidney disease, or unspecified chronic kidney disease: Secondary | ICD-10-CM | POA: Diagnosis not present

## 2023-02-17 DIAGNOSIS — I5032 Chronic diastolic (congestive) heart failure: Secondary | ICD-10-CM | POA: Diagnosis not present

## 2023-02-17 DIAGNOSIS — Z6820 Body mass index (BMI) 20.0-20.9, adult: Secondary | ICD-10-CM | POA: Diagnosis not present

## 2023-02-17 DIAGNOSIS — I10 Essential (primary) hypertension: Secondary | ICD-10-CM | POA: Diagnosis not present

## 2023-02-17 DIAGNOSIS — N1832 Chronic kidney disease, stage 3b: Secondary | ICD-10-CM | POA: Diagnosis not present

## 2023-02-17 DIAGNOSIS — M810 Age-related osteoporosis without current pathological fracture: Secondary | ICD-10-CM | POA: Diagnosis not present

## 2023-02-17 DIAGNOSIS — N183 Chronic kidney disease, stage 3 unspecified: Secondary | ICD-10-CM | POA: Diagnosis not present

## 2023-02-17 DIAGNOSIS — E213 Hyperparathyroidism, unspecified: Secondary | ICD-10-CM | POA: Diagnosis not present

## 2023-02-17 DIAGNOSIS — E7800 Pure hypercholesterolemia, unspecified: Secondary | ICD-10-CM | POA: Diagnosis not present

## 2023-03-11 ENCOUNTER — Other Ambulatory Visit: Payer: Self-pay | Admitting: Cardiovascular Disease

## 2023-03-16 ENCOUNTER — Emergency Department (HOSPITAL_BASED_OUTPATIENT_CLINIC_OR_DEPARTMENT_OTHER): Payer: Medicare Other | Admitting: Radiology

## 2023-03-16 ENCOUNTER — Emergency Department (HOSPITAL_BASED_OUTPATIENT_CLINIC_OR_DEPARTMENT_OTHER)
Admission: EM | Admit: 2023-03-16 | Discharge: 2023-03-16 | Disposition: A | Discharge status: Home / Self Care | Payer: Medicare Other

## 2023-03-16 ENCOUNTER — Other Ambulatory Visit: Payer: Self-pay

## 2023-03-16 DIAGNOSIS — M79642 Pain in left hand: Secondary | ICD-10-CM | POA: Diagnosis not present

## 2023-03-16 DIAGNOSIS — Z7982 Long term (current) use of aspirin: Secondary | ICD-10-CM | POA: Diagnosis not present

## 2023-03-16 DIAGNOSIS — M85842 Other specified disorders of bone density and structure, left hand: Secondary | ICD-10-CM | POA: Diagnosis not present

## 2023-03-16 DIAGNOSIS — I10 Essential (primary) hypertension: Secondary | ICD-10-CM | POA: Insufficient documentation

## 2023-03-16 DIAGNOSIS — R202 Paresthesia of skin: Secondary | ICD-10-CM | POA: Insufficient documentation

## 2023-03-16 DIAGNOSIS — Z79899 Other long term (current) drug therapy: Secondary | ICD-10-CM | POA: Diagnosis not present

## 2023-03-16 DIAGNOSIS — Z043 Encounter for examination and observation following other accident: Secondary | ICD-10-CM | POA: Diagnosis not present

## 2023-03-16 DIAGNOSIS — M1812 Unilateral primary osteoarthritis of first carpometacarpal joint, left hand: Secondary | ICD-10-CM | POA: Diagnosis not present

## 2023-03-16 NOTE — Discharge Instructions (Signed)
Take over-the-counter Tylenol or Motrin for pain.  Please follow-up with your primary doctor.  Return immediately if develop headache, vision changes, facial droop, unilateral weakness, chest pain, shortness of breath, severe pain or any new or worsening symptoms that are concerning to you.

## 2023-03-16 NOTE — ED Notes (Signed)
Pt verbalized understanding of d/c instructions, meds, and followup care. Denies questions. VSS, no distress noted. Steady gait to exit with all belongings. ACE wrap applied for comfort.

## 2023-03-16 NOTE — ED Triage Notes (Addendum)
Left hand weakness and pain x2 weeks. Family reports fall about 2 weeks ago and caught self with hand. No other focal neuro deficits. Denies CP, SOB. No left arm pain- hand only. Radial pulses equal.  HX HTN.

## 2023-03-16 NOTE — ED Provider Notes (Signed)
[place] EMERGENCY DEPARTMENT AT [facility] Provider Note   CSN: [id number] Arrival date & time: 03/16/23  1633     History  Chief Complaint  Patient presents with   Hand Pain    Left    Amber Gonzales is a 89 y.o. female.  Present emergency department left hand pain times roughly a week.  She did notice some pain 2 weeks ago after she stumbled and caught herself with the same hand.  Noted some intermittent pain that has been improved with a cream that she rubs on her hand.  She also notes of some intermittent tingling sensation to the tips of her fingers of her thumb middle finger and ring finger.  None currently.  No fevers, chills, chest pain, shortness of breath.  No headache no vision changes no unilateral weakness.  No aphasia.  No other trauma no rash.   Hand Pain       Home Medications Prior to Admission medications   Medication Sig Start Date End Date Taking? Authorizing Provider  acetaminophen (TYLENOL) 325 MG tablet Take 2 tablets (650 mg total) by mouth every 4 (four) hours as needed for mild pain (or temp > 37.5 C (99.5 F)). 11/05/21   Rodolph Bong, MD  aspirin 81 MG EC tablet Take 1 tablet (81 mg total) by mouth daily. Swallow whole. 11/06/21   Rodolph Bong, MD  carvedilol (COREG) 12.5 MG tablet TAKE 1 TABLET (12.5MG  TOTAL) BY MOUTH TWICE A DAY WITH MEALS 06/25/22   O'Stroebel, Ronnald Ramp, MD  Cholecalciferol (VITAMIN D3 PO) Take 1 capsule by mouth daily.    [provider]  furosemide (LASIX) 20 MG tablet Take 1 tablet (20 mg total) by mouth daily as needed for fluid or edema. Patient taking differently: Take 20 mg by mouth as needed for fluid or edema. 11/05/21 11/05/22  Rodolph Bong, MD  hydrALAZINE (APRESOLINE) 50 MG tablet Take 1 tablet (50 mg total) by mouth 3 (three) times daily. 06/09/22   O'NealRonnald Ramp, MD  isosorbide mononitrate (IMDUR) 30 MG 24 hr tablet Take 0.5 tablets (15 mg total) by mouth daily. 11/06/21   Rodolph Bong, MD  rosuvastatin (CRESTOR) 10 MG tablet Take 1 tablet (10 mg total) by mouth daily. Patient not taking: Reported on 06/19/2022 11/06/21   Rodolph Bong, MD      Allergies    Patient has no known allergies.    Review of Systems   Review of Systems  Physical Exam Updated Vital Signs BP (!) 186/85 (BP Location: Right Arm)   Pulse (!) 56   Temp 98.1 F (36.7 C) (Oral)   Resp 16   SpO2 100%  Physical Exam Vitals and nursing note reviewed.  Constitutional:      General: She is not in acute distress.    Appearance: She is not toxic-appearing.  HENT:     Head: Normocephalic.     Mouth/Throat:     Mouth: Mucous membranes are moist.  Eyes:     Extraocular Movements: Extraocular movements intact.     Conjunctiva/sclera: Conjunctivae normal.  Cardiovascular:     Rate and Rhythm: Normal rate and regular rhythm.  Pulmonary:     Effort: Pulmonary effort is normal.     Breath sounds: Normal breath sounds.  Abdominal:     General: Abdomen is flat. There is no distension.     Tenderness: There is no abdominal tenderness. There is no guarding or rebound.  Musculoskeletal:  Cervical back: Normal range of motion. No tenderness.     Comments: Hands with no obvious deformities.  No bruising or erythema.  No warmth.  2+ radial pulses.  Brisk cap refill in all fingers.  Able to make okay sign, wave, cross fingers, resist opposition laterally.  She has good grip strength bilaterally.  No anatomic snuffbox tenderness.  No tenderness with axial loading of the thumb.  Neurological:     General: No focal deficit present.     Mental Status: She is alert.  Psychiatric:        Mood and Affect: Mood normal.        Behavior: Behavior normal.     ED Results / Procedures / Treatments   Labs (all labs ordered are listed, but only abnormal results are displayed) Labs Reviewed - No data to display  EKG None  Radiology DG Hand Complete Left  Result Date: 03/16/2023 CLINICAL DATA:   Fall EXAM: LEFT HAND - COMPLETE 3+ VIEW COMPARISON:  None Available. FINDINGS: Bones are osteopenic. There is no evidence of fracture or dislocation. There are moderate degenerative changes of the first carpometacarpal joint with joint space narrowing and osteophyte formation. Peripheral vascular calcifications are present. Soft tissues are otherwise unremarkable. IMPRESSION: 1. No acute fracture or dislocation. 2. Moderate degenerative changes of the first carpometacarpal joint. Electronically Signed   By: Darliss Cheney M.D.   On: 03/16/2023 19:51    Procedures Procedures    Medications Ordered in ED Medications - No data to display  ED Course/ Medical Decision Making/ A&P                                 Medical Decision Making Well-appearing 89 year old female presenting emergency department for left hand pain after fall.  Afebrile, hypertensive, but not having chest pain, shortness of breath, headache, blurred vision.  No localizing deficits on exam.  X-ray of hand with osteoarthritis, but no acute fractures.  She has no anatomic snuffbox tenderness.  Exam reassuring.  Her tingly sensation does not necessarily follow a specific nerve distribution but seemingly more pronounced in a median nerve distribution from her description.  She is not currently having symptoms and not reproducible on exam. Discussed bracing for a median nerve compression type picture and follow-up with PCP as well as over-the-counter pain medications.  Patient agreeable to plan.  Stable for discharge at this time.  Amount and/or Complexity of Data Reviewed Radiology: ordered.           Final Clinical Impression(s) / ED Diagnoses Final diagnoses:  None    Rx / DC Orders ED Discharge Orders     None         Coral Spikes, DO 03/16/23 2228

## 2023-04-06 DIAGNOSIS — M8580 Other specified disorders of bone density and structure, unspecified site: Secondary | ICD-10-CM | POA: Diagnosis not present

## 2023-04-06 DIAGNOSIS — M25532 Pain in left wrist: Secondary | ICD-10-CM | POA: Diagnosis not present

## 2023-04-06 DIAGNOSIS — I10 Essential (primary) hypertension: Secondary | ICD-10-CM | POA: Diagnosis not present

## 2023-07-10 ENCOUNTER — Other Ambulatory Visit: Payer: Self-pay | Admitting: Cardiovascular Disease

## 2023-07-21 ENCOUNTER — Other Ambulatory Visit: Payer: Self-pay | Admitting: Cardiovascular Disease

## 2023-08-07 ENCOUNTER — Other Ambulatory Visit: Payer: Self-pay | Admitting: Cardiovascular Disease

## 2023-08-08 ENCOUNTER — Other Ambulatory Visit: Payer: Self-pay | Admitting: Cardiovascular Disease

## 2023-08-10 ENCOUNTER — Telehealth: Payer: Self-pay | Admitting: Cardiovascular Disease

## 2023-08-10 NOTE — Telephone Encounter (Signed)
Called and unable to reach patient left message message for patient to call office. Patient has a appointment scheduled for 08/13/23 @ 9:00 am.

## 2023-08-10 NOTE — Telephone Encounter (Signed)
Called and advised daughter Dr Val Eagle' Rena will discuss if patient is suppose to be on Plavix. No further questions at this time.

## 2023-08-10 NOTE — Telephone Encounter (Signed)
Daugher Aggie Cosier) called to return RN's call to her mother.

## 2023-08-10 NOTE — Telephone Encounter (Signed)
Patient's daughter states patient has been "dragging" and moving slower recently. She states patient informed her that she doesn't feel the same and notices she's been walking slower. Please advise.

## 2023-08-10 NOTE — Telephone Encounter (Signed)
Called and spoke to patient's daughter. Verified patient's name and DOB. Below message relayed per Dr Val EagleJennette Kettle. She was laso requesting a refill for Plavix 75 mg. Informed her that medications was discontinued in 06/09/2022. Please advise.

## 2023-08-10 NOTE — Telephone Encounter (Signed)
*  STAT* If patient is at the pharmacy, call can be transferred to refill team.   1. Which medications need to be refilled? (please list name of each medication and dose if known)  carvedilol (COREG) 12.5 MG tablet  2. Which pharmacy/location (including street and city if local pharmacy) is medication to be sent to? [id number] - [place], [place] - [address]  3. Do they need a 30 day or 90 day supply?   90 day supply  Daughter states patient has been completely out of medication for 1-2 weeks.

## 2023-08-12 NOTE — Progress Notes (Signed)
 Cardiology Office Note:  .   Date:  08/13/2023  ID:  Amber Gonzales, DOB 1929-08-31, MRN [id number] PCP: Domenick Loma, NP  [facility] Providers Cardiologist:  None    History of Present Illness: .    Chief Complaint  Patient presents with   Follow-up    12 months.   Edema    Ankles.    Amber Gonzales is a 89 y.o. female with history of CHF, CVA, HTN who presents for follow-up.   History of Present Illness   Amber Gonzales is a 89 year old female with systolic heart failure who presents for follow-up. She lives with her brother and son, who assist with her care.  She has a history of systolic heart failure EF 40-45% and is not currently taking Lasix , as it was discontinued some time ago. She is on aspirin  and carvedilol . She experiences occasional leg swelling, which is not noticeable to her but has been observed by others. No chest pain or significant breathing difficulties.  She reports experiencing shoulder pain, particularly when lying on it at night. Imaging revealed arthritis in the shoulder. She has been trying to avoid lying on it and has used Tylenol  for relief.  She had a stroke in late April or May 2023 and is currently on aspirin  monotherapy. She is not taking Plavix  as it was deemed unnecessary by her neurologist.  She has lost weight and is currently at 108 pounds. She has lost her dentures, which affects her ability to chew certain foods.          Problem List Systolic HF -EF 35-40% 11/2021 -EF 40-45% 03/13/2022 2. LBBB -QRS 134 ms 3. HTN 4. 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 CKD IIIb 6. HLD -T chol 142, HDL 58, LDL 72, TG 55    ROS: All other ROS reviewed and negative. Pertinent positives noted in the HPI.     Studies Reviewed: SABRA   EKG Interpretation Date/Time:  Thursday August 13 2023 09:04:25 EST Ventricular Rate:  77 PR Interval:  168 QRS Duration:  130 QT Interval:  406 QTC Calculation: 459 R Axis:   -61  Text  Interpretation: Sinus rhythm with Premature atrial complexes Left bundle branch block When compared with ECG of Confirmed by Barbaraann Kotyk [phone]) on 08/13/2023 9:07:23 AM    TTE 03/13/2022  1. Left ventricular ejection fraction, by estimation, is 40 to 45%. Left  ventricular ejection fraction by 3D volume is 53 %. The left ventricle has  mildly decreased function. The left ventricle demonstrates global  hypokinesis. Left ventricular diastolic   parameters are consistent with Grade I diastolic dysfunction (impaired  relaxation).   2. Right ventricular systolic function is normal. The right ventricular  size is normal. There is normal pulmonary artery systolic pressure. The  estimated right ventricular systolic pressure is 26.8 mmHg.   3. The mitral valve is normal in structure. No evidence of mitral valve  regurgitation.   4. The aortic valve is tricuspid. Aortic valve regurgitation is not  visualized. No aortic stenosis is present.   5. The inferior vena cava is normal in size with greater than 50%  respiratory variability, suggesting right atrial pressure of 3 mmHg.  Physical Exam:   VS:  BP (!) 146/78 (BP Location: Right Arm, Patient Position: Sitting, Cuff Size: Normal)   Pulse 77   Ht 5' 2.5 (1.588 m)   Wt 108 lb (49 kg)   BMI 19.44 kg/m    Wt  Readings from Last 3 Encounters:  08/13/23 108 lb (49 kg)  06/19/22 113 lb 3.2 oz (51.3 kg)  06/09/22 112 lb 12.8 oz (51.2 kg)    GEN: Well nourished, well developed in no acute distress NECK: No JVD; No carotid bruits CARDIAC: RRR, no murmurs, rubs, gallops RESPIRATORY:  Clear to auscultation without rales, wheezing or rhonchi  ABDOMEN: Soft, non-tender, non-distended EXTREMITIES:  No edema; No deformity  ASSESSMENT AND PLAN: .   Assessment and Plan    Right Shoulder Pain Likely musculoskeletal or neurologic in nature. No cardiac involvement. Pain exacerbated by lying on it. -Avoid lying on the right shoulder. -Continue Tylenol  as  needed for pain.  Systolic Heart Failure EF 40-45%, likely related to left bundle branch block and hypertension. Limited symptoms, periodic leg edema. -Resume Imdur  15mg  daily. -Continue Carvedilol  12.5mg  twice daily, Hydralazine  50mg  three times daily. -Use Lasix  20mg  as needed for leg edema. -Use compression stockings for leg edema.  Small Vessel Cerebrovascular Disease History of stroke in May 2023. LDL cholesterol at goal (72). -Discontinue Plavix . -Continue Aspirin  81mg  daily.   HLD -on crestor  10 mg daily. LDL 72, close enough to goal given age.  Follow-up -Return in 1 year or sooner if any issues arise.              Follow-up: Return in about 1 year (around 08/12/2024).   Signed, Darryle DASEN. Barbaraann, MD, [facility]  [address] [place], [place] [address] [phone]  9:25 AM

## 2023-08-13 ENCOUNTER — Encounter: Payer: Self-pay | Admitting: Cardiovascular Disease

## 2023-08-13 ENCOUNTER — Ambulatory Visit: Payer: Medicare Other | Attending: Cardiovascular Disease | Admitting: Cardiovascular Disease

## 2023-08-13 VITALS — BP 146/78 | HR 77 | Ht 62.5 in | Wt 108.0 lb

## 2023-08-13 DIAGNOSIS — E782 Mixed hyperlipidemia: Secondary | ICD-10-CM | POA: Diagnosis not present

## 2023-08-13 DIAGNOSIS — I447 Left bundle-branch block, unspecified: Secondary | ICD-10-CM

## 2023-08-13 DIAGNOSIS — I5022 Chronic systolic (congestive) heart failure: Secondary | ICD-10-CM

## 2023-08-13 DIAGNOSIS — I10 Essential (primary) hypertension: Secondary | ICD-10-CM | POA: Diagnosis not present

## 2023-08-13 DIAGNOSIS — R0602 Shortness of breath: Secondary | ICD-10-CM

## 2023-08-13 DIAGNOSIS — M79601 Pain in right arm: Secondary | ICD-10-CM

## 2023-08-13 MED ORDER — FUROSEMIDE 20 MG PO TABS: 20.0000 mg | ORAL_TABLET | ORAL | 1 refills | Status: DC | PRN

## 2023-08-13 MED ORDER — HYDRALAZINE HCL 50 MG PO TABS: 50.0000 mg | ORAL_TABLET | Freq: Three times a day (TID) | ORAL | 1 refills | Status: AC

## 2023-08-13 MED ORDER — ASPIRIN 81 MG PO TBEC: 81.0000 mg | DELAYED_RELEASE_TABLET | Freq: Every day | ORAL | 11 refills | Status: AC

## 2023-08-13 MED ORDER — CARVEDILOL 12.5 MG PO TABS: 12.5000 mg | ORAL_TABLET | Freq: Two times a day (BID) | ORAL | 0 refills | Status: DC

## 2023-08-13 MED ORDER — ROSUVASTATIN CALCIUM 10 MG PO TABS: 10.0000 mg | ORAL_TABLET | Freq: Every day | ORAL | 1 refills | Status: AC

## 2023-08-13 MED ORDER — ISOSORBIDE MONONITRATE ER 30 MG PO TB24: 15.0000 mg | ORAL_TABLET | Freq: Every day | ORAL | 1 refills | Status: DC

## 2023-08-13 NOTE — Patient Instructions (Signed)
 Medication Instructions:  - START IMDUR  15MG  DAILY   Your physician recommends that you continue on your current medications as directed. Please refer to the Current Medication list given to you today.    *If you need a refill on your cardiac medications before your next appointment, please call your pharmacy*   Lab Work: None    If you have labs (blood work) drawn today and your tests are completely normal, you will receive your results only by: MyChart Message (if you have MyChart) OR A paper copy in the mail If you have any lab test that is abnormal or we need to change your treatment, we will call you to review the results.   Testing/Procedures: NONE    Follow-Up: At [facility], you and your health needs are our priority.  As part of our continuing mission to provide you with exceptional heart care, we have created designated Provider Care Teams.  These Care Teams include your primary Cardiologist (physician) and Advanced Practice Providers (APPs -  Physician Assistants and Nurse Practitioners) who all work together to provide you with the care you need, when you need it.  We recommend signing up for the patient portal called MyChart.  Sign up information is provided on this After Visit Summary.  MyChart is used to connect with patients for Virtual Visits (Telemedicine).  Patients are able to view lab/test results, encounter notes, upcoming appointments, etc.  Non-urgent messages can be sent to your provider as well.   To learn more about what you can do with MyChart, go to [email or web address].    Your next appointment:   1 year(s)  The format for your next appointment:   In Person  Provider:   Josefa Beauvais, FNP, Jon Hails, PA-C, Callie Goodrich, PA-C, Kathleen Johnson, PA-C, Delon Holts, PA-C, Lamarr Satterfield, DNP, ANP, Hao Meng, PA-C, Damien Braver, NP, or Katlyn West, NP      Other Instructions

## 2023-08-25 DIAGNOSIS — I10 Essential (primary) hypertension: Secondary | ICD-10-CM | POA: Diagnosis not present

## 2023-08-25 DIAGNOSIS — E46 Unspecified protein-calorie malnutrition: Secondary | ICD-10-CM | POA: Diagnosis not present

## 2023-08-25 DIAGNOSIS — I5032 Chronic diastolic (congestive) heart failure: Secondary | ICD-10-CM | POA: Diagnosis not present

## 2023-08-25 DIAGNOSIS — E213 Hyperparathyroidism, unspecified: Secondary | ICD-10-CM | POA: Diagnosis not present

## 2023-08-25 DIAGNOSIS — M810 Age-related osteoporosis without current pathological fracture: Secondary | ICD-10-CM | POA: Diagnosis not present

## 2023-08-25 DIAGNOSIS — N1832 Chronic kidney disease, stage 3b: Secondary | ICD-10-CM | POA: Diagnosis not present

## 2023-08-25 DIAGNOSIS — E7800 Pure hypercholesterolemia, unspecified: Secondary | ICD-10-CM | POA: Diagnosis not present

## 2023-09-07 DIAGNOSIS — N1832 Chronic kidney disease, stage 3b: Secondary | ICD-10-CM | POA: Diagnosis not present

## 2023-09-07 DIAGNOSIS — N189 Chronic kidney disease, unspecified: Secondary | ICD-10-CM | POA: Diagnosis not present

## 2023-09-15 DIAGNOSIS — N2581 Secondary hyperparathyroidism of renal origin: Secondary | ICD-10-CM | POA: Diagnosis not present

## 2023-09-15 DIAGNOSIS — E872 Acidosis, unspecified: Secondary | ICD-10-CM | POA: Diagnosis not present

## 2023-09-15 DIAGNOSIS — N183 Chronic kidney disease, stage 3 unspecified: Secondary | ICD-10-CM | POA: Diagnosis not present

## 2023-09-15 DIAGNOSIS — I129 Hypertensive chronic kidney disease with stage 1 through stage 4 chronic kidney disease, or unspecified chronic kidney disease: Secondary | ICD-10-CM | POA: Diagnosis not present

## 2023-09-15 DIAGNOSIS — D631 Anemia in chronic kidney disease: Secondary | ICD-10-CM | POA: Diagnosis not present

## 2023-09-18 ENCOUNTER — Other Ambulatory Visit: Payer: Self-pay | Admitting: Cardiovascular Disease

## 2023-11-21 ENCOUNTER — Other Ambulatory Visit: Payer: Self-pay | Admitting: Cardiovascular Disease

## 2024-02-16 ENCOUNTER — Other Ambulatory Visit: Payer: Self-pay

## 2024-02-16 ENCOUNTER — Emergency Department (HOSPITAL_BASED_OUTPATIENT_CLINIC_OR_DEPARTMENT_OTHER)

## 2024-02-16 ENCOUNTER — Encounter (HOSPITAL_BASED_OUTPATIENT_CLINIC_OR_DEPARTMENT_OTHER): Payer: Self-pay

## 2024-02-16 ENCOUNTER — Emergency Department (HOSPITAL_BASED_OUTPATIENT_CLINIC_OR_DEPARTMENT_OTHER)
Admission: EM | Admit: 2024-02-16 | Discharge: 2024-02-16 | Disposition: A | Discharge status: Home / Self Care | Attending: Emergency Medicine | Admitting: Emergency Medicine

## 2024-02-16 DIAGNOSIS — R531 Weakness: Secondary | ICD-10-CM | POA: Insufficient documentation

## 2024-02-16 DIAGNOSIS — Z7982 Long term (current) use of aspirin: Secondary | ICD-10-CM | POA: Insufficient documentation

## 2024-02-16 DIAGNOSIS — R2681 Unsteadiness on feet: Secondary | ICD-10-CM

## 2024-02-16 DIAGNOSIS — D649 Anemia, unspecified: Secondary | ICD-10-CM | POA: Diagnosis not present

## 2024-02-16 DIAGNOSIS — G9389 Other specified disorders of brain: Secondary | ICD-10-CM | POA: Diagnosis not present

## 2024-02-16 DIAGNOSIS — Z79899 Other long term (current) drug therapy: Secondary | ICD-10-CM | POA: Insufficient documentation

## 2024-02-16 DIAGNOSIS — I10 Essential (primary) hypertension: Secondary | ICD-10-CM | POA: Diagnosis not present

## 2024-02-16 DIAGNOSIS — R9082 White matter disease, unspecified: Secondary | ICD-10-CM | POA: Diagnosis not present

## 2024-02-16 DIAGNOSIS — R269 Unspecified abnormalities of gait and mobility: Secondary | ICD-10-CM | POA: Insufficient documentation

## 2024-02-16 DIAGNOSIS — E119 Type 2 diabetes mellitus without complications: Secondary | ICD-10-CM | POA: Diagnosis not present

## 2024-02-16 DIAGNOSIS — R29818 Other symptoms and signs involving the nervous system: Secondary | ICD-10-CM | POA: Diagnosis not present

## 2024-02-16 LAB — URINALYSIS, ROUTINE W REFLEX MICROSCOPIC
Bilirubin Urine: NEGATIVE
Glucose, UA: NEGATIVE mg/dL
Hgb urine dipstick: NEGATIVE
Ketones, ur: NEGATIVE mg/dL
Leukocytes,Ua: NEGATIVE
Nitrite: NEGATIVE
Protein, ur: NEGATIVE mg/dL
Specific Gravity, Urine: 1.014 (ref 1.005–1.030)
pH: 7 (ref 5.0–8.0)

## 2024-02-16 LAB — COMPREHENSIVE METABOLIC PANEL WITH GFR
ALT: 10 U/L (ref 0–44)
AST: 32 U/L (ref 15–41)
Albumin: 3.3 g/dL — ABNORMAL LOW (ref 3.5–5.0)
Alkaline Phosphatase: 114 U/L (ref 38–126)
Anion gap: 10 (ref 5–15)
BUN: 19 mg/dL (ref 8–23)
CO2: 25 mmol/L (ref 22–32)
Calcium: 8.6 mg/dL — ABNORMAL LOW (ref 8.9–10.3)
Chloride: 107 mmol/L (ref 98–111)
Creatinine, Ser: 1.46 mg/dL — ABNORMAL HIGH (ref 0.44–1.00)
GFR, Estimated: 34 mL/min — ABNORMAL LOW (ref >60)
Glucose, Bld: 112 mg/dL — ABNORMAL HIGH (ref 70–99)
Potassium: 4.1 mmol/L (ref 3.5–5.1)
Sodium: 142 mmol/L (ref 135–145)
Total Bilirubin: 0.4 mg/dL (ref 0.0–1.2)
Total Protein: 6.8 g/dL (ref 6.5–8.1)

## 2024-02-16 LAB — CBC
HCT: 31.2 % — ABNORMAL LOW (ref 36.0–46.0)
Hemoglobin: 10 g/dL — ABNORMAL LOW (ref 12.0–15.0)
MCH: 33.2 pg (ref 26.0–34.0)
MCHC: 32.1 g/dL (ref 30.0–36.0)
MCV: 103.7 fL — ABNORMAL HIGH (ref 80.0–100.0)
Platelets: 222 K/uL (ref 150–400)
RBC: 3.01 MIL/uL — ABNORMAL LOW (ref 3.87–5.11)
RDW: 12.2 % (ref 11.5–15.5)
WBC: 7.5 K/uL (ref 4.0–10.5)
nRBC: 0 % (ref 0.0–0.2)

## 2024-02-16 NOTE — ED Triage Notes (Signed)
 Patient's daughter reports she has been having pink vaginal discharge

## 2024-02-16 NOTE — Discharge Instructions (Signed)
 Your test today did not show any signs of acute stroke.  You are anemic but this is stable and there is no need for any emergent treatment.  Follow-up with your primary care doctor to discuss possible further evaluation such as physical therapy.  Return to the ED as needed for worsening symptoms.

## 2024-02-16 NOTE — ED Provider Notes (Signed)
 [place] EMERGENCY DEPARTMENT AT [facility] Provider Note   CSN: [id number] Arrival date & time: 02/16/24  1214     Patient presents with: Weakness   Amber Gonzales is a 89 y.o. female.    Weakness    Patient has a history of diabetes hypertension kidney disease incontinence.  Patient states she has been having trouble with her balance ongoing for a few months.  She has been feeling weaker and feeling like her movements take more effort.  She has not had any falls.  She is not having any difficulty with her speech or vision.  No unilateral weakness or numbness.  She is not having chest pain fever shortness of breath or other concerning symptoms.  Triage notes indicate patient has been having vaginal discharge.  She has been evaluated by GYN and urology for this condition.  That is not new.  She has been told it could be related to her  urinary incontinence  Prior to Admission medications   Medication Sig Start Date End Date Taking? Authorizing Provider  acetaminophen  (TYLENOL ) 325 MG tablet Take 2 tablets (650 mg total) by mouth every 4 (four) hours as needed for mild pain (or temp > 37.5 C (99.5 F)). 11/05/21   Sebastian Toribio GAILS, MD  aspirin  EC 81 MG tablet Take 1 tablet (81 mg total) by mouth daily. Swallow whole. 08/13/23   Barbaraann Darryle Ned, MD  carvedilol  (COREG ) 12.5 MG tablet Take 1 tablet (12.5 mg total) by mouth 2 (two) times daily with a meal. 08/13/23   O'Tursi, Darryle Ned, MD  Cholecalciferol (VITAMIN D3 PO) Take 1 capsule by mouth daily.    [provider]  furosemide  (LASIX ) 20 MG tablet Take 1 tablet (20 mg total) by mouth as needed for fluid or edema. 09/18/23   O'Sprankle, Darryle Ned, MD  hydrALAZINE  (APRESOLINE ) 50 MG tablet Take 1 tablet (50 mg total) by mouth 3 (three) times daily. 08/13/23   Barbaraann Darryle Ned, MD  isosorbide  mononitrate (IMDUR ) 30 MG 24 hr tablet TAKE 1/2 OF A TABLET (15 MG TOTAL) BY MOUTH DAILY 11/23/23   O'Kindler, Darryle Ned,  MD  rosuvastatin  (CRESTOR ) 10 MG tablet Take 1 tablet (10 mg total) by mouth daily. 08/13/23   O'NealDarryle Ned, MD    Allergies: Patient has no known allergies.    Review of Systems  Neurological:  Positive for weakness.    Updated Vital Signs BP (!) 181/80   Pulse (!) 53   Temp 98.4 F (36.9 C) (Oral)   Resp 13   SpO2 100%   Physical Exam Vitals and nursing note reviewed.  Constitutional:      Appearance: She is well-developed.  HENT:     Head: Normocephalic and atraumatic.     Right Ear: External ear normal.     Left Ear: External ear normal.  Eyes:     General: No visual field deficit or scleral icterus.       Right eye: No discharge.        Left eye: No discharge.     Conjunctiva/sclera: Conjunctivae normal.  Neck:     Trachea: No tracheal deviation.  Cardiovascular:     Rate and Rhythm: Normal rate and regular rhythm.  Pulmonary:     Effort: Pulmonary effort is normal. No respiratory distress.     Breath sounds: Normal breath sounds. No stridor. No wheezing or rales.  Abdominal:     General: Bowel sounds are normal. There is no distension.  Palpations: Abdomen is soft.     Tenderness: There is no abdominal tenderness. There is no guarding or rebound.  Musculoskeletal:        General: No tenderness.     Cervical back: Neck supple.  Skin:    General: Skin is warm and dry.     Findings: No rash.  Neurological:     Mental Status: She is alert and oriented to person, place, and time.     Cranial Nerves: No cranial nerve deficit, dysarthria or facial asymmetry.     Sensory: No sensory deficit.     Motor: No abnormal muscle tone, seizure activity or pronator drift.     Coordination: Coordination normal.     Comments:  able to hold both legs off bed for 5 seconds, sensation intact in all extremities,  no left or right sided neglect, normal finger-nose exam bilaterally, no nystagmus noted   Psychiatric:        Mood and Affect: Mood normal.     (all labs  ordered are listed, but only abnormal results are displayed) Labs Reviewed  COMPREHENSIVE METABOLIC PANEL WITH GFR - Abnormal; Notable for the following components:      Result Value   Glucose, Bld 112 (*)    Creatinine, Ser 1.46 (*)    Calcium  8.6 (*)    Albumin 3.3 (*)    GFR, Estimated 34 (*)    All other components within normal limits  CBC - Abnormal; Notable for the following components:   RBC 3.01 (*)    Hemoglobin 10.0 (*)    HCT 31.2 (*)    MCV 103.7 (*)    All other components within normal limits  URINALYSIS, ROUTINE W REFLEX MICROSCOPIC  CBG MONITORING, ED    EKG: EKG Interpretation Date/Time:  Tuesday February 16 2024 13:05:50 EDT Ventricular Rate:  47 PR Interval:  170 QRS Duration:  143 QT Interval:  475 QTC Calculation: 420 R Axis:   -61  Text Interpretation: Sinus bradycardia Atrial premature complex Left bundle branch block Confirmed by Randol Simmonds [id number]) on 02/16/2024 1:10:04 PM  Radiology: CT Head Wo Contrast Result Date: 02/16/2024 EXAM: CT HEAD WITHOUT CONTRAST 02/16/2024 01:27:10 PM TECHNIQUE: CT of the head was performed without the administration of intravenous contrast. Automated exposure control, iterative reconstruction, and/or weight based adjustment of the mA/kV was utilized to reduce the radiation dose to as low as reasonably achievable. COMPARISON: MRI of the head dated 11/04/2021. CLINICAL HISTORY: Neuro deficit, acute, stroke suspected. Patient reports weakness and balance problems for a few months. She denies recent falls. She denies chest pain, shortness of breath, weakness, or vision changes. FINDINGS: BRAIN AND VENTRICLES: No acute hemorrhage. Gray-white differentiation is preserved. No hydrocephalus. No extra-axial collection. No mass effect or midline shift. Age-related cerebral atrophy and extensive cerebral white matter disease. Chronic encephalomalacia changes are noted in the left cerebellar hemisphere. ORBITS: No acute abnormality. SINUSES:  No acute abnormality. SOFT TISSUES AND SKULL: No acute soft tissue abnormality. No skull fracture. Moderate calcification within the carotid siphons. IMPRESSION: 1. No acute intracranial abnormality. 2. Age-related cerebral atrophy and extensive cerebral white matter disease. 3. Chronic encephalomalacia changes in the left cerebellar hemisphere. Electronically signed by: evalene coho 02/16/2024 02:09 PM EDT RP Workstation: [id number]     Procedures   Medications Ordered in the ED - No data to display  Clinical Course as of 02/16/24 1510  Tue Feb 16, 2024  1437 Urinalysis, Routine w reflex microscopic -Urine, Clean Catch Urinalysis not suggestive of  infection.  CBC shows anemia with a hemoglobin of 10, this is similar to previous [JK]  1437 Comprehensive metabolic panel(!) Creatinine elevated but stable compared to baseline [JK]  1438 CT scan without acute findings [JK]    Clinical Course User Index [JK] Randol Simmonds, MD                                 Medical Decision Making Amount and/or Complexity of Data Reviewed Labs: ordered. Decision-making details documented in ED Course. Radiology: ordered.   Patient presented to the ED with complaints of some some difficulty with her gait ongoing for several months.  On exam patient does not have any focal neurologic deficits.  Low suspicion for stroke TIA.  The scan does not show any acute abnormalities.  MRI is not available at this facility but I have low suspicion for stroke and considering the chronic nature of her symptoms I do not feel that any emergent MRI is necessary  Patient does not appear to have any signs of urinary tract infection.  No severe anemia or electrolyte normalities.  Discussed outpatient follow-up with her PCP to discuss possible physical therapy referral.  Daughter present at bedside  Evaluation and diagnostic testing in the emergency department does not suggest an emergent condition requiring admission or  immediate intervention beyond what has been performed at this time.  The patient is safe for discharge and has been instructed to return immediately for worsening symptoms, change in symptoms or any other concerns.     Final diagnoses:  Chronic anemia  Gait instability    ED Discharge Orders     None          Randol Simmonds, MD 02/16/24 1510

## 2024-02-16 NOTE — ED Triage Notes (Signed)
 Patient reports weakness and balance problems for a few months. She denies recent falls. She denies, chest pain, shortness of breath, weakness, or vision changes.

## 2024-03-09 DIAGNOSIS — N1832 Chronic kidney disease, stage 3b: Secondary | ICD-10-CM | POA: Diagnosis not present

## 2024-03-17 DIAGNOSIS — I129 Hypertensive chronic kidney disease with stage 1 through stage 4 chronic kidney disease, or unspecified chronic kidney disease: Secondary | ICD-10-CM | POA: Diagnosis not present

## 2024-03-17 DIAGNOSIS — N2581 Secondary hyperparathyroidism of renal origin: Secondary | ICD-10-CM | POA: Diagnosis not present

## 2024-03-17 DIAGNOSIS — N183 Chronic kidney disease, stage 3 unspecified: Secondary | ICD-10-CM | POA: Diagnosis not present

## 2024-03-17 DIAGNOSIS — D631 Anemia in chronic kidney disease: Secondary | ICD-10-CM | POA: Diagnosis not present

## 2024-03-30 DIAGNOSIS — E1122 Type 2 diabetes mellitus with diabetic chronic kidney disease: Secondary | ICD-10-CM | POA: Diagnosis not present

## 2024-03-30 DIAGNOSIS — Z23 Encounter for immunization: Secondary | ICD-10-CM | POA: Diagnosis not present

## 2024-03-30 DIAGNOSIS — Z0000 Encounter for general adult medical examination without abnormal findings: Secondary | ICD-10-CM | POA: Diagnosis not present

## 2024-03-30 DIAGNOSIS — E7800 Pure hypercholesterolemia, unspecified: Secondary | ICD-10-CM | POA: Diagnosis not present

## 2024-03-30 DIAGNOSIS — I5032 Chronic diastolic (congestive) heart failure: Secondary | ICD-10-CM | POA: Diagnosis not present

## 2024-03-30 DIAGNOSIS — I10 Essential (primary) hypertension: Secondary | ICD-10-CM | POA: Diagnosis not present

## 2024-03-30 DIAGNOSIS — M8580 Other specified disorders of bone density and structure, unspecified site: Secondary | ICD-10-CM | POA: Diagnosis not present

## 2024-03-30 DIAGNOSIS — N183 Chronic kidney disease, stage 3 unspecified: Secondary | ICD-10-CM | POA: Diagnosis not present

## 2024-04-28 ENCOUNTER — Telehealth: Payer: Self-pay | Admitting: *Deleted

## 2024-04-28 NOTE — Telephone Encounter (Signed)
 Spoke with the patient regarding the referral to GYN oncology. Patient scheduled as new patient with Dr Eldonna on 11/3 at 11:15 am. Patient given an arrival time of 10:45 am.   Explained to the patient the the doctor will perform a pelvic exam at this visit. Patient given the policy that only one visitor allowed and that visitor must be over 16 yrs are allowed in the Cancer Center. Patient given the address/phone number for the clinic and that the center offers free valet service. Patient aware that masks optional.

## 2024-04-29 ENCOUNTER — Other Ambulatory Visit: Payer: Self-pay | Admitting: Cardiovascular Disease

## 2024-05-05 ENCOUNTER — Encounter: Payer: Self-pay | Admitting: Psychiatry

## 2024-05-09 ENCOUNTER — Other Ambulatory Visit (HOSPITAL_COMMUNITY): Payer: Self-pay | Admitting: Psychiatry

## 2024-05-09 ENCOUNTER — Inpatient Hospital Stay: Attending: Psychiatry | Admitting: Psychiatry

## 2024-05-09 ENCOUNTER — Inpatient Hospital Stay: Admitting: Gynecologic Oncology

## 2024-05-09 ENCOUNTER — Encounter: Payer: Self-pay | Admitting: Psychiatry

## 2024-05-09 VITALS — BP 156/70

## 2024-05-09 VITALS — BP 144/55 | HR 74 | Temp 98.9°F | Resp 18 | Ht 62.0 in | Wt 102.4 lb

## 2024-05-09 DIAGNOSIS — N950 Postmenopausal bleeding: Secondary | ICD-10-CM | POA: Diagnosis not present

## 2024-05-09 NOTE — Progress Notes (Signed)
 GYNECOLOGIC ONCOLOGY NEW PATIENT CONSULTATION  Date of Service: 05/09/2024 Referring Provider: Rojean Adolph Lindau, DO   ASSESSMENT AND PLAN: Amber Gonzales is a 89 y.o. woman with postmenopausal bleeding.  Was unable to tolerate attempt at endometrial sampling in clinic with her OB/GYN.  Not comfortable with attempt in our office today.  Ultrasound could not clearly discern endometrial thickening but reported some heterogeneity.  Given the above, recommend sampling in the OR.  Patient was consented for: dilation and curettage on 05/31/24. Will request ultrasound guidance.  The risks of surgery were discussed in detail and she understands these to including but not limited to bleeding requiring a blood transfusion, infection, injury to adjacent organs (including but not limited to the bowels, bladder, ureters, nerves, blood vessels), thromboembolic events, unforseen complication, possible need for re-exploration, and medical complications such as heart attack, stroke, pneumonia.  If the patient experiences any of these events, she understands that her hospitalization or recovery may be prolonged and that she may need to take additional medications for a prolonged period. The patient will receive DVT and antibiotic prophylaxis as indicated. She voiced a clear understanding. She had the opportunity to ask questions and informed consent was obtained today. She wishes to proceed.  Will request clearance from PCP. All preoperative instructions were reviewed. Postoperative expectations were also reviewed. Written handouts were provided to the patient.   A copy of this note was sent to the patient's referring provider.  Hoy Masters, MD Gynecologic Oncology   Medical Decision Making I personally spent  TOTAL 45 minutes face-to-face and non-face-to-face in the care of this patient, which includes all pre, intra, and post visit time on the date of service.   ------------  CC: Postmenopausal  bleeding  HISTORY OF PRESENT ILLNESS:  Amber Gonzales is a 89 y.o. woman who is seen in consultation at the request of Rojean Adolph Lindau, DO for evaluation of postmenopausal bleeding.  Patient was first seen by her OB/GYN on 11/17/2023 for report of vaginal discharge.  A  wet prep was collected and patient was advised on genital hygiene.  The wet prep returned negative and she was advised on obtaining a pelvic ultrasound.  This was performed on 12/02/2023 and noted a uterus measuring 7.73 x 4.27 x 5.6 cm.  The report noted heterogeneous and irregular echotexture of the uterus with the endometrium not visualized and neither ovary visualized.  Patient returned on 04/21/2024 noting blood in her vaginal discharge with pink in the discharge starting a few weeks prior.  An endometrial biopsy was not able to be performed at that time due to patient discomfort with exam.  She returned on 04/25/2024 for another attempt at endometrial biopsy with pretreatment with lidocaine.  The procedure was discontinued due to patient discomfort.  She was then referred to our clinic in the setting.  Today patient presents with her daughter.  Reports that the bleeding has been on and off.  Currently bleeding today.  Also notes constipation the past 2 weeks.  Otherwise denies abdominal bloating, early satiety, significant weight loss, change bladder habits.   Has a history of pulmonary embolism in 2022 for which she was on Eliquis .  She and her daughter think maybe she was only on it a short period of time.  No longer on Eliquis .  Takes a baby aspirin  daily.   PAST MEDICAL HISTORY: Past Medical History:  Diagnosis Date   Diabetes mellitus without complication (HCC)    Hypertension    Pulmonary embolism (HCC)  Renal disorder    Stroke [facility])     PAST SURGICAL HISTORY: History reviewed. No pertinent surgical history.  OB/GYN HISTORY: OB History  Gravida Para Term Preterm AB Living  7 7 7   7   SAB IAB Ectopic Multiple  Live Births      7    # Outcome Date GA Lbr Len/2nd Weight Sex Type Anes PTL Lv  7 Term      Vag-Spont   LIV  6 Term      Vag-Spont   LIV  5 Term      Vag-Spont   LIV  4 Term      Vag-Spont   LIV  3 Term      Vag-Spont   LIV  2 Term      Vag-Spont   LIV  1 Term      Vag-Spont   LIV      Age at menarche: 52 Age at menopause: 12 Hx of HRT: no Hx of STI: no Last pap: unknown History of abnormal pap smears: no  SCREENING STUDIES:  Last mammogram: 2018 Last colonoscopy: >10 years ago  MEDICATIONS:  Current Outpatient Medications:    acetaminophen  (TYLENOL ) 325 MG tablet, Take 2 tablets (650 mg total) by mouth every 4 (four) hours as needed for mild pain (or temp > 37.5 C (99.5 F))., Disp: , Rfl:    aspirin  EC 81 MG tablet, Take 1 tablet (81 mg total) by mouth daily. Swallow whole., Disp: 30 tablet, Rfl: 11   carvedilol  (COREG ) 12.5 MG tablet, TAKE 1 TABLET (12.5MG  TOTAL) BY MOUTH TWICE A DAY WITH MEALS, Disp: 180 tablet, Rfl: 1   Cholecalciferol (VITAMIN D3 PO), Take 1 capsule by mouth daily., Disp: , Rfl:    furosemide  (LASIX ) 20 MG tablet, Take 20 mg by mouth., Disp: , Rfl:    hydrALAZINE  (APRESOLINE ) 50 MG tablet, Take 1 tablet (50 mg total) by mouth 3 (three) times daily., Disp: 360 tablet, Rfl: 1   isosorbide  mononitrate (IMDUR ) 30 MG 24 hr tablet, TAKE 1/2 OF A TABLET (15 MG TOTAL) BY MOUTH DAILY, Disp: 45 tablet, Rfl: 3   OVER THE COUNTER MEDICATION, Take 65 mg by mouth daily. (Patient taking differently: Take 65 mg by mouth daily. High Potency Iron), Disp: , Rfl:    rosuvastatin  (CRESTOR ) 10 MG tablet, Take 1 tablet (10 mg total) by mouth daily., Disp: 30 tablet, Rfl: 1  ALLERGIES: No Known Allergies  FAMILY HISTORY: Family History  Problem Relation Age of Onset   Stroke Neg Hx    Breast cancer Neg Hx    Ovarian cancer Neg Hx    Endometrial cancer Neg Hx    Colon cancer Neg Hx     SOCIAL HISTORY: Social History   Socioeconomic History   Marital status: Married     Spouse name: Not on file   Number of children: Not on file   Years of education: Not on file   Highest education level: Not on file  Occupational History   Not on file  Tobacco Use   Smoking status: Never   Smokeless tobacco: Never  Substance and Sexual Activity   Alcohol use: Never   Drug use: Never   Sexual activity: Not Currently  Other Topics Concern   Not on file  Social History Narrative   Not on file   Social Drivers of Health   Financial Resource Strain: Not on file  Food Insecurity: No Food Insecurity (05/05/2024)   Hunger Vital Sign  Worried About Programme Researcher, Broadcasting/film/video in the Last Year: Never true    Ran Out of Food in the Last Year: Never true  Transportation Needs: No Transportation Needs (03/05/2022)   PRAPARE - Administrator, Civil Service (Medical): No    Lack of Transportation (Non-Medical): No  Physical Activity: Not on file  Stress: Not on file  Social Connections: Not on file  Intimate Partner Violence: Not At Risk (05/05/2024)   Humiliation, Afraid, Rape, and Kick questionnaire    Fear of Current or Ex-Partner: No    Emotionally Abused: No    Physically Abused: No    Sexually Abused: No    REVIEW OF SYSTEMS: New patient intake form was reviewed.  Complete 10-system review is negative except for the following: Constipation, joint pain, pelvic pain, vaginal bleeding, vaginal discharge  PHYSICAL EXAM: BP (!) 144/55 (BP Location: Right Arm, Patient Position: Sitting)   Pulse 74   Temp 98.9 F (37.2 C) (Oral)   Resp 18   Ht 5' 2 (1.575 m)   Wt 102 lb 6.4 oz (46.4 kg)   SpO2 100%   BMI 18.73 kg/m  Constitutional: No acute distress. Neuro/Psych: Alert, oriented.  Head and Neck: Normocephalic, atraumatic. Neck symmetric without masses. Sclera anicteric.  Respiratory: Normal work of breathing. Clear to auscultation bilaterally. Cardiovascular: Regular rate and rhythm, no murmurs, rubs, or gallops. Abdomen: Normoactive bowel sounds.  Soft, non-distended, non-tender to palpation. No masses appreciated. Extremities: Grossly normal range of motion. Warm, well perfused. No edema bilaterally. Skin: No rashes or lesions. Lymphatic: No cervical, supraclavicular, or inguinal adenopathy. Genitourinary: External genitalia without lesions. Urethral meatus without lesions or prolapse.  Clear to yellowish discharge on vulva.  Single-digit exam with normal cervix palpated, stool in the rectal vault.  No masses or nodularity noted.  Exam limited by patient comfort.   Exam chaperoned by Eleanor Epps, NP   LABORATORY AND RADIOLOGIC DATA: Outside medical records were reviewed to synthesize the above history, along with the history and physical obtained during the visit.  Outside laboratory, and imaging reports were reviewed, with pertinent results below.   WBC  Date Value Ref Range Status  02/16/2024 7.5 4.0 - 10.5 K/uL Final   Hemoglobin  Date Value Ref Range Status  02/16/2024 10.0 (L) 12.0 - 15.0 g/dL Final   HCT  Date Value Ref Range Status  02/16/2024 31.2 (L) 36.0 - 46.0 % Final   Platelets  Date Value Ref Range Status  02/16/2024 222 150 - 400 K/uL Final   Magnesium  Date Value Ref Range Status  11/05/2021 2.2 1.7 - 2.4 mg/dL Final    Comment:    Performed at [facility], [address], [place], [place] [address]   Creatinine, Ser  Date Value Ref Range Status  02/16/2024 1.46 (H) 0.44 - 1.00 mg/dL Final   AST  Date Value Ref Range Status  02/16/2024 32 15 - 41 U/L Final   ALT  Date Value Ref Range Status  02/16/2024 10 0 - 44 U/L Final    Pelvic ultrasound (12/02/23): Anteverted uterus: 7.73 x 4.27 x 5.6 cm Uterus with heterogeneous and irregular echotexture.  Endometrium not visualized.  Neither ovary visualized.  No adnexal masses seen.

## 2024-05-09 NOTE — Patient Instructions (Addendum)
 Preparing for your Surgery   Plan for surgery on 05/31/2024 with Dr. Hoy Masters at [facility]. You will be scheduled for dilation and curettage of the uterus, possible ultrasound guidance.    Pre-operative Testing -You will receive a phone call from presurgical testing at [facility] to discuss surgery instructions and arrange for lab work if needed.   -Bring your insurance card, copy of an advanced directive if applicable, medication list.   -You can continue taking your baby aspirin  with your last dose the morning before surgery. DO NOT TAKE THE MORNING OF SURGERY.   -Do not take supplements such as fish oil (omega 3), red yeast rice, turmeric before your surgery. You want to avoid medications with aspirin  in them including headache powders such as BC or Goody's), Excedrin migraine.   Day Before Surgery at Home -You will be advised you can have clear liquids up until 3 hours before your surgery.     Your role in recovery Your role is to become active as soon as directed by your doctor, while still giving yourself time to heal.  Rest when you feel tired. You will be asked to do the following in order to speed your recovery:   - Cough and breathe deeply. This helps to clear and expand your lungs and can prevent pneumonia after surgery.  - STAY ACTIVE WHEN YOU GET HOME. Do mild physical activity. Walking or moving your legs help your circulation and body functions return to normal. Do not try to get up or walk alone the first time after surgery.   -If you develop swelling on one leg or the other, pain in the back of your leg, redness/warmth in one of your legs, please call the office or go to the Emergency Room to have a doppler to rule out a blood clot. For shortness of breath, chest pain-seek care in the Emergency Room as soon as possible. - Actively manage your pain. Managing your pain lets you move in comfort. We will ask you to rate your pain on a scale of zero to  10. It is your responsibility to tell your doctor or nurse where and how much you hurt so your pain can be treated.   Special Considerations -Your final pathology results from surgery should be available around one week after surgery and the results will be relayed to you when available.   -FMLA forms can be faxed to [phone] and please allow 5-7 business days for completion.   Pain Management After Surgery -Make sure that you have Tylenol  at home IF YOU ARE ABLE TO TAKE THESE MEDICATION to use on a regular basis after surgery for pain control.     Bowel Regimen -It is important to prevent constipation and drink adequate amounts of liquids. You can use a laxative as needed for constipation.   Risks of Surgery Risks of surgery are low but include bleeding, infection, damage to surrounding structures, re-operation, blood clots, and very rarely death.   AFTER SURGERY INSTRUCTIONS   Return to work:  1-2 days if applicable   Activity: 1. Be up and out of the bed during the day.  Take a nap if needed.  You may walk up steps but be careful and use the hand rail.  Stair climbing will tire you more than you think, you may need to stop part way and rest.    2. No lifting or straining for 2 weeks over 10 pounds. No pushing, pulling, straining for 2  weeks.   3. No driving for minimum 24 hours after surgery.  Do not drive if you are taking narcotic pain medicine and make sure that your reaction time has returned.    4. You can shower as soon as the next day after surgery. Shower daily. No tub baths or submerging your body in water until cleared by your surgeon (2 weeks minimum). If you have the soap that was given to you by pre-surgical testing that was used before surgery, you do not need to use it afterwards because this can irritate your incisions.    5. No sexual activity and nothing in the vagina for 2 weeks.   6. You may experience vaginal spotting and discharge after surgery.  The  spotting is normal but if you experience heavy bleeding, call our office.   7. Take Tylenol  for pain if you are able to take these medications for discomfort as needed.     Diet: 1. Low sodium Heart Healthy Diet is recommended but you are cleared to resume your normal (before surgery) diet after your procedure.   2. It is safe to use a laxative, such as Miralax or Colace, if you have difficulty moving your bowels.    Wound Care: 1. Keep clean and dry.  Shower daily.   Reasons to call the Doctor: Fever - Oral temperature greater than 100.4 degrees Fahrenheit Foul-smelling vaginal discharge Difficulty urinating Nausea and vomiting Increased pain at the site of the incision that is unrelieved with pain medicine. Difficulty breathing with or without chest pain New calf pain especially if only on one side Sudden, continuing increased vaginal bleeding with or without clots.   Contacts: For questions or concerns you should contact:   Dr. Hoy Masters at [phone]   Eleanor Epps, NP at [phone]   After Hours: call [phone] and have the GYN Oncologist paged/contacted (after 5 pm or on the weekends).   Messages sent via mychart are for non-urgent matters and are not responded to after hours so for urgent needs, please call the after hours number.

## 2024-05-09 NOTE — Progress Notes (Signed)
 Patient here for a consult with Dr. Eldonna and for a pre-operative appointment prior to her scheduled surgery on 05/31/2024. She is scheduled for a dilation and curettage of the uterus, possible ultrasound guidance. The surgery was discussed in detail.  See after visit summary for additional details.       Discussed post-op pain management in detail including the aspects of the enhanced recovery pathway.  We discussed the use of tylenol  post-op and to monitor for a maximum of 4,000 mg in a 24 hour period.  Discussed bowel regimen in detail.     Discussed the use of SCDs and measures to take at home to prevent DVT including frequent mobility.  Reportable signs and symptoms of DVT discussed. Post-operative instructions discussed and expectations for after surgery. Incisional care discussed as well including reportable signs and symptoms including erythema, drainage, wound separation.     30 minutes spent with the patient.  Verbalizing understanding of material discussed. No needs or concerns voiced at the end of the visit.   Advised patient to call for any needs.  Advised that her post-operative medications had been prescribed and could be picked up at any time.    This appointment is included in the global surgical bundle as pre-operative teaching and has no charge.

## 2024-05-10 ENCOUNTER — Telehealth: Payer: Self-pay | Admitting: *Deleted

## 2024-05-10 NOTE — Telephone Encounter (Signed)
 Per Dr Eldonna fax records and surgical optimization form

## 2024-05-10 NOTE — Telephone Encounter (Signed)
 Spoke with patient's daughter Amber Gonzales who called to reschedule her mother's surgery date with Dr. Eldonna on 11/25. Amber Gonzales is requesting a date in December.   Per Amber Epps, NP several dates are open in December: 2, 9, 16th and 30th.  Amber Gonzales chose December 9 th. Advised her that her message will be relayed to providers and Amber Gonzales's surgery date will be changed from 05/31/24 to 06/14/24.

## 2024-05-11 ENCOUNTER — Encounter: Payer: Self-pay | Admitting: Family Medicine

## 2024-05-17 ENCOUNTER — Ambulatory Visit: Admitting: Podiatry

## 2024-05-17 DIAGNOSIS — M79672 Pain in left foot: Secondary | ICD-10-CM | POA: Diagnosis not present

## 2024-05-17 DIAGNOSIS — M79671 Pain in right foot: Secondary | ICD-10-CM

## 2024-05-17 DIAGNOSIS — B351 Tinea unguium: Secondary | ICD-10-CM | POA: Diagnosis not present

## 2024-05-17 DIAGNOSIS — L600 Ingrowing nail: Secondary | ICD-10-CM

## 2024-05-17 NOTE — Progress Notes (Signed)
 Patient presents for evaluation and treatment of tenderness and some redness around nails feet.  Tenderness around toes with walking and wearing shoes.  Tenderness especially on the fourth toe right.  Physical exam:  General appearance: Alert, pleasant, and in no acute distress.  Vascular: Pedal pulses: DP 1/4 B/L, PT 2/4 B/L.  Moderate edema lower legs bilaterally.  Capillary refill time immediate bilaterally  Neurologic:  Dermatologic:  Nails thickened, disfigured, discolored 1-5 BL with subungual debris.  Redness and hypertrophic nail folds along nail folds bilaterally but no signs of drainage or infection.  Skin thin atrophic no hair growth lower extremity bilaterally.  Dry xerotic skin.  Fourth nail is extremely long and incurvated and deeply creating a small superficial wound on the distal aspect of the fourth toe.  No signs of infection.  Musculoskeletal:  Hammertoe deformities 2 through 5 bilaterally.   Diagnosis: 1. Painful onychomycotic nails 1 through 5 bilaterally. 2. Pain toes 1 through 5 bilaterally. 3.  Ingrown nail fourth nail right.  Plan: -New patient office visit for evaluation management level 3.  Modifier 25. -Discussed ingrown nail on the fourth toe.  Will have him clean the ear with some soapy water and apply some antibiotic ointment for about a week.  Should be fine.  Best long-term course to be to keep the nails trimmed down.  I do not think should be a good candidate for nail surgery. -Debrided onychomycotic nails 1 through 5 bilaterally.  Sharply debrided nails with nail clipper and reduced with a power bur.  Return 3 months [facility]

## 2024-05-23 ENCOUNTER — Encounter (HOSPITAL_COMMUNITY)

## 2024-05-25 ENCOUNTER — Telehealth (HOSPITAL_BASED_OUTPATIENT_CLINIC_OR_DEPARTMENT_OTHER): Payer: Self-pay

## 2024-05-25 NOTE — Telephone Encounter (Signed)
   Pre-operative Risk Assessment    Patient Name: Amber Gonzales  DOB: January 28, 1930 MRN: [id number]   Date of last office visit: 08/23/2023 wioth Dr. Barbaraann  Date of next office visit: None  Request for Surgical Clearance    Procedure:  Dilation and curettage of the uterus, possible ultrasound guidance   Date of Surgery:  Clearance 05/31/24                                 Surgeon:  Dr. Hoy Masters  Surgeon's Group or Practice Name:  [facility]  Phone number:  [phone]  Fax number:  [phone]   Type of Clearance Requested:   - Medical  - Pharmacy:  Hold Aspirin  -Does not specify   Type of Anesthesia:  General    Additional requests/questions:  None  SignedPatrcia Iverson CROME   05/25/2024, 5:04 PM

## 2024-05-25 NOTE — Telephone Encounter (Deleted)
 It is sorry

## 2024-05-25 NOTE — Telephone Encounter (Addendum)
 Spoke with the patient's daughter Verneita. Patient wants to cancel her surgery. Per the daughter the patient just doesn't want to do that and the testing. Explained that the message would be given to Dr Eldonna and the office may call her back with recommendations for follow up.

## 2024-05-25 NOTE — Telephone Encounter (Signed)
 Per Dr Ricky [facility]) patient's PCP requested cardiology clearance  Surgical optimization form sent to Dr Barbaraann [phone])

## 2024-05-26 NOTE — Telephone Encounter (Addendum)
 Tried to reach pt to set up tele preop appt. Our office just received late 05/25/24 a clearance request for 05/31/24.   No answer or vm came on. We will try to reach the pt later today.   Will send FYI to requesting office to see notes from Amber Gonzales, [facility] in regard to ASA:  It appears that she is on aspirin  for history of small vessel cerebrovascular disease/history of stroke May 2023.  Might be advantageous to also clear aspirin  hold with PCP/neurology.   Amber LOISE Fabry, PA-C  05/26/2024, 8:06 AM

## 2024-05-26 NOTE — Telephone Encounter (Signed)
 I tried to call the pt again today, see notes.

## 2024-05-26 NOTE — Telephone Encounter (Signed)
   Name: Amber Gonzales  DOB: 1930-07-02  MRN: [id number]  Primary Cardiologist: None  Chart reviewed as part of pre-operative protocol coverage. Because of Amber Gonzales past medical history and time since last visit, she will require a follow-up telephone visit in order to better assess preoperative cardiovascular risk.  Pre-op covering staff: - Please schedule appointment and call patient to inform them. If patient already had an upcoming appointment within acceptable timeframe, please add pre-op clearance to the appointment notes so provider is aware. - Please contact requesting surgeon's office via preferred method (i.e, phone, fax) to inform them of need for appointment prior to surgery.  If asymptomatic at the time of phone call, can hold aspirin  x 5 to 7 days prior to procedure and resume when medically safe to do so.  It appears that she is on aspirin  for history of small vessel cerebrovascular disease/history of stroke May 2023.  Might be advantageous to also clear aspirin  hold with PCP/neurology.  Orren LOISE Fabry, PA-C  05/26/2024, 8:06 AM

## 2024-05-27 NOTE — Telephone Encounter (Signed)
 Called pt this am no answer.

## 2024-05-30 ENCOUNTER — Telehealth: Payer: Self-pay | Admitting: Psychiatry

## 2024-05-30 NOTE — Telephone Encounter (Signed)
 Our office has tried x 3 as of today to reach the pt to set up a tele preop appt. I will update the requesting office as well.    Per DPR ok to leave detailed message on home answering machine, but recording come on and says message full, to enter remote access code. Could not leave message again.

## 2024-05-30 NOTE — Telephone Encounter (Signed)
 Called patient's preferred for regarding her wishes to cancel her procedure.  Phone numbers for her daughter who assists with her medical care.  Spoke with her daughter regarding patient's decision to cancel the dilation and curettage.  In brief reviewed pros and cons of this.  Recommend follow-up to be able to discuss this in more detail to ensure that the patient is aware of the risks associated with postmenopausal bleeding of unknown etiology at this time.  Patient's daughter reports that she and the patient do not feel that that is necessary at this time.  Would prefer to just follow back up if something were to change.  Reviewed in brief that the same options available currently may not be available in the future if something were to change.  So again, would recommend follow-up to discuss that so that everyone is on the same page and aware of the risk and benefits of delayed diagnosis.  Again, patient's daughter reports that the patient would prefer not to follow-up at this time and monitor with her OB/GYN.  Recommend that they reach out to her OB/GYN to schedule follow-up in this case.

## 2024-05-30 NOTE — Telephone Encounter (Signed)
 Received fax from the cardiology office that they can't reach the patient for clearance appt. Surgery for 11/25

## 2024-05-31 ENCOUNTER — Ambulatory Visit (HOSPITAL_COMMUNITY)

## 2024-05-31 DIAGNOSIS — N950 Postmenopausal bleeding: Secondary | ICD-10-CM

## 2024-06-01 ENCOUNTER — Encounter: Payer: Self-pay | Admitting: Family Medicine

## 2024-06-08 ENCOUNTER — Telehealth: Payer: Self-pay | Admitting: Cardiovascular Disease

## 2024-06-08 NOTE — Telephone Encounter (Signed)
 Spoke with patient's daughter.   Patient has been having chest tightness x1 week, intermittently. Occurs at rest. Episodes last 15 minutes. No radiating pain. No shortness of breath. Isolated episode of lightheadedness.   Her last visit was 08/2023. Advised if she potentially needs pre-op clearance, would need eval. Added on to MD scheduled for 12/15 @10am . Daughter thinks she can bring her. Asked that if they need to reschedule, to have that go thru nurse as would not want her appointment moved months out for possible acute issue.

## 2024-06-08 NOTE — Telephone Encounter (Signed)
 Pt c/o of Chest Pain: STAT if active (IN THIS MOMENT) CP, including tightness, pressure, jaw pain, shoulder/upper arm/back pain, SOB, nausea, and vomiting.  1. Are you having CP right now (tightness, pressure, or discomfort)? tightness  2. Are you experiencing any other symptoms (ex. SOB, nausea, vomiting, sweating)? No   3. How long have you been experiencing CP? 1 week   4. Is your CP continuous or coming and going? Coming and going   5. Have you taken Nitroglycerin? no  6. If CP returns before callback, please consider calling 911. ?

## 2024-06-10 ENCOUNTER — Ambulatory Visit: Admitting: Cardiovascular Disease

## 2024-06-13 ENCOUNTER — Inpatient Hospital Stay: Admitting: Psychiatry

## 2024-06-14 ENCOUNTER — Ambulatory Visit (HOSPITAL_COMMUNITY)

## 2024-06-14 ENCOUNTER — Ambulatory Visit: Admit: 2024-06-14 | Admitting: Psychiatry

## 2024-06-14 DIAGNOSIS — N950 Postmenopausal bleeding: Secondary | ICD-10-CM

## 2024-06-14 SURGERY — DILATION AND CURETTAGE
Anesthesia: General

## 2024-06-20 ENCOUNTER — Telehealth: Payer: Self-pay

## 2024-06-20 DIAGNOSIS — R531 Weakness: Secondary | ICD-10-CM

## 2024-06-24 NOTE — Progress Notes (Deleted)
" °  Cardiology Office Note:  .   Date:  06/24/2024  ID:  A[facility], DOB 1930/01/16, MRN [id number] PCP: Sun, Vyvyan, MD  [facility] Providers Cardiologist:  None   History of Present Illness: .   No chief complaint on file.   Amber [facility] is a 89 y.o. female with below history who presents for follow-up.   History of Present Illness              *** Problem List Systolic HF -EF 35-40% 11/2021 -EF 40-45% 03/13/2022 2. LBBB -QRS 134 ms 3. HTN 4. 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 CKD IIIb 6. HLD -T chol 142, HDL 58, LDL 72, TG 55    ROS: All other ROS reviewed and negative. Pertinent positives noted in the HPI.     Studies Reviewed: SABRA       Physical Exam:   VS:  There were no vitals taken for this visit.   Wt Readings from Last 3 Encounters:  05/09/24 102 lb 6.4 oz (46.4 kg)  08/13/23 108 lb (49 kg)  06/19/22 113 lb 3.2 oz (51.3 kg)    GEN: Well nourished, well developed in no acute distress NECK: No JVD; No carotid bruits CARDIAC: ***RRR, no murmurs, rubs, gallops RESPIRATORY:  Clear to auscultation without rales, wheezing or rhonchi  ABDOMEN: Soft, non-tender, non-distended EXTREMITIES:  No edema; No deformity  ASSESSMENT AND PLAN: .   Assessment and Plan                 {Are you ordering a CV Procedure (e.g. stress test, cath, DCCV, TEE, etc)?   Press F2        :[id number]}   Follow-up: No follow-ups on file.  Signed, Darryle DASEN. Barbaraann, MD, [facility]  [facility]  [address] [place], [place] [address] [phone]  1:33 PM   "

## 2024-06-28 ENCOUNTER — Ambulatory Visit: Admitting: Cardiovascular Disease

## 2024-07-04 ENCOUNTER — Inpatient Hospital Stay: Admitting: Psychiatry

## 2024-07-08 ENCOUNTER — Telehealth: Payer: Self-pay

## 2024-07-08 NOTE — Progress Notes (Signed)
 This patient's daughter, Verneita Angle called needing help with personal care services for the patient. This patient is not on our current attribution list and therefore is not eligible for services.   . A comprehensive list of several resources is available through our [employer] [facility] [email or web address] This solicitor shows the resource support services currently available across our system and in the community.  Community members can also access information about resources in their area by calling 2-1-1. [place] 211 is a free and confidential information and referral service provided by [employer] [facility] [place]  and powered by local [employer] [place] .     I mentioned the [employer] [facility] [place] phone [phone] to the patient's daughter.    Leita Lyme, BLANCH CAULK Kake  [facility], [facility] Management Assistant [phone]

## 2024-07-13 ENCOUNTER — Ambulatory Visit: Admitting: Emergency Medicine

## 2024-07-14 ENCOUNTER — Emergency Department (HOSPITAL_BASED_OUTPATIENT_CLINIC_OR_DEPARTMENT_OTHER)
Admission: EM | Admit: 2024-07-14 | Discharge: 2024-07-14 | Disposition: A | Discharge status: Home / Self Care | Attending: Emergency Medicine | Admitting: Emergency Medicine

## 2024-07-14 ENCOUNTER — Emergency Department (HOSPITAL_BASED_OUTPATIENT_CLINIC_OR_DEPARTMENT_OTHER)

## 2024-07-14 ENCOUNTER — Other Ambulatory Visit: Payer: Self-pay

## 2024-07-14 DIAGNOSIS — R269 Unspecified abnormalities of gait and mobility: Secondary | ICD-10-CM | POA: Diagnosis present

## 2024-07-14 DIAGNOSIS — R531 Weakness: Secondary | ICD-10-CM | POA: Insufficient documentation

## 2024-07-14 DIAGNOSIS — D649 Anemia, unspecified: Secondary | ICD-10-CM | POA: Insufficient documentation

## 2024-07-14 DIAGNOSIS — R1030 Lower abdominal pain, unspecified: Secondary | ICD-10-CM | POA: Insufficient documentation

## 2024-07-14 DIAGNOSIS — Z7982 Long term (current) use of aspirin: Secondary | ICD-10-CM | POA: Diagnosis not present

## 2024-07-14 DIAGNOSIS — E1122 Type 2 diabetes mellitus with diabetic chronic kidney disease: Secondary | ICD-10-CM | POA: Diagnosis not present

## 2024-07-14 DIAGNOSIS — E1165 Type 2 diabetes mellitus with hyperglycemia: Secondary | ICD-10-CM | POA: Diagnosis not present

## 2024-07-14 DIAGNOSIS — Z8673 Personal history of transient ischemic attack (TIA), and cerebral infarction without residual deficits: Secondary | ICD-10-CM | POA: Insufficient documentation

## 2024-07-14 DIAGNOSIS — I129 Hypertensive chronic kidney disease with stage 1 through stage 4 chronic kidney disease, or unspecified chronic kidney disease: Secondary | ICD-10-CM | POA: Diagnosis not present

## 2024-07-14 DIAGNOSIS — N858 Other specified noninflammatory disorders of uterus: Secondary | ICD-10-CM | POA: Diagnosis not present

## 2024-07-14 DIAGNOSIS — Z79899 Other long term (current) drug therapy: Secondary | ICD-10-CM | POA: Insufficient documentation

## 2024-07-14 DIAGNOSIS — N189 Chronic kidney disease, unspecified: Secondary | ICD-10-CM | POA: Insufficient documentation

## 2024-07-14 LAB — CBC
HCT: 29.6 % — ABNORMAL LOW (ref 36.0–46.0)
Hemoglobin: 9.6 g/dL — ABNORMAL LOW (ref 12.0–15.0)
MCH: 33.7 pg (ref 26.0–34.0)
MCHC: 32.4 g/dL (ref 30.0–36.0)
MCV: 103.9 fL — ABNORMAL HIGH (ref 80.0–100.0)
Platelets: 342 K/uL (ref 150–400)
RBC: 2.85 MIL/uL — ABNORMAL LOW (ref 3.87–5.11)
RDW: 13.3 % (ref 11.5–15.5)
WBC: 9.5 K/uL (ref 4.0–10.5)
nRBC: 0 % (ref 0.0–0.2)

## 2024-07-14 LAB — COMPREHENSIVE METABOLIC PANEL WITH GFR
ALT: 12 U/L (ref 0–44)
AST: 32 U/L (ref 15–41)
Albumin: 2.6 g/dL — ABNORMAL LOW (ref 3.5–5.0)
Alkaline Phosphatase: 133 U/L — ABNORMAL HIGH (ref 38–126)
Anion gap: 8 (ref 5–15)
BUN: 19 mg/dL (ref 8–23)
CO2: 27 mmol/L (ref 22–32)
Calcium: 8.8 mg/dL — ABNORMAL LOW (ref 8.9–10.3)
Chloride: 106 mmol/L (ref 98–111)
Creatinine, Ser: 1.32 mg/dL — ABNORMAL HIGH (ref 0.44–1.00)
GFR, Estimated: 38 mL/min — ABNORMAL LOW
Glucose, Bld: 165 mg/dL — ABNORMAL HIGH (ref 70–99)
Potassium: 4.3 mmol/L (ref 3.5–5.1)
Sodium: 140 mmol/L (ref 135–145)
Total Bilirubin: 0.4 mg/dL (ref 0.0–1.2)
Total Protein: 6.5 g/dL (ref 6.5–8.1)

## 2024-07-14 LAB — CBG MONITORING, ED: Glucose-Capillary: 163 mg/dL — ABNORMAL HIGH (ref 70–99)

## 2024-07-14 MED ORDER — IOHEXOL 300 MG/ML  SOLN
60.0000 mL | Freq: Once | INTRAMUSCULAR | Status: AC | PRN
  Administered 2024-07-14: 60 mL via INTRAVENOUS

## 2024-07-14 MED ORDER — SODIUM CHLORIDE 0.9 % IV BOLUS
500.0000 mL | Freq: Once | INTRAVENOUS | Status: AC
  Administered 2024-07-14: 500 mL via INTRAVENOUS

## 2024-07-14 NOTE — ED Notes (Signed)
 Reviewed discharge instructions and follow-up care with pt and family. Both verbalized understanding and had no further questions. Pt exited ED without complications.

## 2024-07-14 NOTE — ED Notes (Signed)
Patient states she can not give a urine sample at this time. 

## 2024-07-14 NOTE — ED Provider Notes (Signed)
 " [facility] Provider Note   CSN: [id number] Arrival date & time: 07/14/24  1613     Patient presents with: Weakness and Abdominal Pain   [name] is a 89 y.o. female.   Patient is a 89 year old female with a history of diabetes, hypertension, hyperlipidemia, chronic kidney disease, prior PE several years ago, no longer on anticoagulants who presents with feeling off balance.  History is obtained from the patient and daughter.  It sounds like she has been having trouble walking for about 2 weeks.  She said she also would had some weakness in her right leg and felt like she was dragging it but that seems to have gotten better.  She still feels off balance and is walking very slowly.  She normally walks independently without a walker or other assistive devices.  She denies any fever, URI symptoms, she had an episode of chest discomfort about a week ago but none since then.  No shortness of breath.  No urinary symptoms.  She is having normal bowel movements.  She has been having some pain across her lower abdomen for the last couple of days.  No nausea or vomiting.  No fevers.       Prior to Admission medications  Medication Sig Start Date End Date Taking? Authorizing Provider  acetaminophen  (TYLENOL ) 325 MG tablet Take 2 tablets (650 mg total) by mouth every 4 (four) hours as needed for mild pain (or temp > 37.5 C (99.5 F)). 11/05/21   Sebastian Toribio GAILS, MD  aspirin  EC 81 MG tablet Take 1 tablet (81 mg total) by mouth daily. Swallow whole. 08/13/23   Barbaraann Darryle Ned, MD  carvedilol  (COREG ) 12.5 MG tablet TAKE 1 TABLET (12.5MG  TOTAL) BY MOUTH TWICE A DAY WITH MEALS 05/02/24   O'Tubbs, Darryle Ned, MD  Cholecalciferol (VITAMIN D3 PO) Take 1 capsule by mouth daily.    [provider]  furosemide  (LASIX ) 20 MG tablet Take 20 mg by mouth.    [provider]  hydrALAZINE  (APRESOLINE ) 50 MG tablet Take 1 tablet (50 mg total) by  mouth 3 (three) times daily. 08/13/23   Barbaraann Darryle Ned, MD  isosorbide  mononitrate (IMDUR ) 30 MG 24 hr tablet TAKE 1/2 OF A TABLET (15 MG TOTAL) BY MOUTH DAILY 11/23/23   O'Maino, Darryle Ned, MD  OVER THE COUNTER MEDICATION Take 65 mg by mouth daily. Patient taking differently: Take 65 mg by mouth daily. High Potency Iron    [provider]  rosuvastatin  (CRESTOR ) 10 MG tablet Take 1 tablet (10 mg total) by mouth daily. 08/13/23   O'NealDarryle Ned, MD    Allergies: Patient has no known allergies.    Review of Systems  Constitutional:  Positive for fatigue. Negative for chills, diaphoresis and fever.  HENT:  Negative for congestion, rhinorrhea and sneezing.   Eyes: Negative.   Respiratory:  Negative for cough, chest tightness and shortness of breath.   Cardiovascular:  Negative for chest pain and leg swelling.  Gastrointestinal:  Positive for abdominal pain. Negative for diarrhea, nausea and vomiting.  Genitourinary:  Negative for difficulty urinating, flank pain and frequency.  Musculoskeletal:  Positive for arthralgias (Intermittent pain to the lower extremities, none currently). Negative for back pain.  Skin:  Negative for rash.  Neurological:  Positive for weakness. Negative for dizziness, speech difficulty, numbness and headaches.    Updated Vital Signs BP (!) 173/73   Pulse 76   Temp 98.8 F (37.1 C)  Resp 16   SpO2 97%   Physical Exam Constitutional:      Appearance: She is well-developed.  HENT:     Head: Normocephalic and atraumatic.  Eyes:     Pupils: Pupils are equal, round, and reactive to light.  Cardiovascular:     Rate and Rhythm: Normal rate and regular rhythm.     Heart sounds: Normal heart sounds.  Pulmonary:     Effort: Pulmonary effort is normal. No respiratory distress.     Breath sounds: Normal breath sounds. No wheezing or rales.  Chest:     Chest wall: No tenderness.  Abdominal:     General: Bowel sounds are normal.      Palpations: Abdomen is soft.     Tenderness: There is abdominal tenderness in the suprapubic area. There is no guarding or rebound.  Musculoskeletal:        General: Normal range of motion.     Cervical back: Normal range of motion and neck supple.     Comments: No swelling in the lower extremities.  She has chronic skin changes consistent with probable poor perfusion.  I do not appreciate any palpable pulses in the feet but dorsalis pedis pulses are obtained by Doppler bilaterally although the right is weaker than the left.  Her feet are warm bilaterally.  No color or temperature change.  Lymphadenopathy:     Cervical: No cervical adenopathy.  Skin:    General: Skin is warm and dry.     Findings: No rash.  Neurological:     General: No focal deficit present.     Mental Status: She is alert and oriented to person, place, and time.     Comments: Motor 5/5 all extremities Sensation grossly intact to LT all extremities Finger to Nose intact, no pronator drift CN II-XII grossly intact Unable to ambulate without two-person assist.  She says she feels off balance and feels like she is going to fall.      (all labs ordered are listed, but only abnormal results are displayed) Labs Reviewed  COMPREHENSIVE METABOLIC PANEL WITH GFR - Abnormal; Notable for the following components:      Result Value   Glucose, Bld 165 (*)    Creatinine, Ser 1.32 (*)    Calcium  8.8 (*)    Albumin 2.6 (*)    Alkaline Phosphatase 133 (*)    GFR, Estimated 38 (*)    All other components within normal limits  CBC - Abnormal; Notable for the following components:   RBC 2.85 (*)    Hemoglobin 9.6 (*)    HCT 29.6 (*)    MCV 103.9 (*)    All other components within normal limits  CBG MONITORING, ED - Abnormal; Notable for the following components:   Glucose-Capillary 163 (*)    All other components within normal limits  URINALYSIS, ROUTINE W REFLEX MICROSCOPIC  CBG MONITORING, ED    EKG: EKG  Interpretation Date/Time:  Thursday July 14 2024 16:25:27 EST Ventricular Rate:  88 PR Interval:  160 QRS Duration:  124 QT Interval:  388 QTC Calculation: 469 R Axis:   255  Text Interpretation: Normal sinus rhythm Right superior axis deviation Minimal voltage criteria for LVH, may be normal variant ( Cornell product ) Septal infarct , age undetermined T wave abnormality, consider lateral ischemia Abnormal ECG When compared with ECG of 16-Feb-2024 13:05, PREVIOUS ECG IS PRESENT SINCE LAST TRACING HEART RATE HAS INCREASED Confirmed by Lenor Hollering [phone]) on 07/14/2024 4:32:25 PM  Radiology: CT Head Wo Contrast Result Date: 07/14/2024 EXAM: CT HEAD WITH CONTRAST 07/14/2024 06:34:51 PM TECHNIQUE: CT of the head was performed with the administration of 60 mL iohexol  (OMNIPAQUE ) 300 MG/ML solution. Automated exposure control, iterative reconstruction, and/or weight based adjustment of the mA/kV was utilized to reduce the radiation dose to as low as reasonably achievable. COMPARISON: CT head 07/14/2024 08:12:25 AM. CLINICAL HISTORY: Neuro deficit, acute, stroke suspected. FINDINGS: BRAIN AND VENTRICLES: Cerebral ventricle sizes are concordant with the degree of cerebral volume loss. Patchy and confluent areas of decreased attenuation are noted throughout the deep and periventricular white matter of the cerebral hemispheres bilaterally suggestive of chronic microvascular ischemic changes. Chronic left cerebellar infarction. Atherosclerotic calcifications are present within the cavernous internal carotid and vertebral arteries. No acute hemorrhage. No evidence of acute infarct. No hydrocephalus. No extra-axial collection. No mass effect or midline shift. ORBITS: Right lens replacement. No acute abnormality. SINUSES: Partially visualized polypoid maxillary sinus mucosal thickening. SOFT TISSUES AND SKULL: Breast thickening. No acute soft tissue abnormality. No skull fracture. IMPRESSION: 1. No acute  intracranial abnormality. Electronically signed by: Morgane Naveau MD MD 07/14/2024 07:14 PM EST RP Workstation: [id number]   CT ABDOMEN PELVIS W CONTRAST Result Date: 07/14/2024 EXAM: CT ABDOMEN AND PELVIS WITH CONTRAST 07/14/2024 06:34:51 PM TECHNIQUE: CT of the abdomen and pelvis was performed with the administration of 60 mL of iohexol  (OMNIPAQUE ) 300 MG/ML solution. Multiplanar reformatted images are provided for review. Automated exposure control, iterative reconstruction, and/or weight-based adjustment of the mA/kV was utilized to reduce the radiation dose to as low as reasonably achievable. COMPARISON: CT abdomen and pelvis 12/22/2020. CLINICAL HISTORY: RLQ abdominal pain. FINDINGS: LOWER CHEST: Numerous new pulmonary nodules in the right lower lobe measuring up to 14 mm concerning for metastatic disease. LIVER: The liver is unremarkable. GALLBLADDER AND BILE DUCTS: Gallbladder is unremarkable. No biliary ductal dilatation. SPLEEN: No acute abnormality. PANCREAS: No acute abnormality. ADRENAL GLANDS: No acute abnormality. KIDNEYS, URETERS AND BLADDER: Left renal cyst measuring 2 cm. No stones in the kidneys or ureters. No hydronephrosis. No perinephric or periureteral stranding. Urinary bladder is unremarkable. GI AND BOWEL: The stomach is decompressed, but grossly within normal limits. Gastric wall thickening would be difficult to exclude. Colonic diverticula are present. The appendix is not visualized. There is no bowel obstruction. PERITONEUM AND RETROPERITONEUM: No ascites. No free air. VASCULATURE: Aorta is normal in caliber. There are severe atherosclerotic calcifications of the aorta. LYMPH NODES: No lymphadenopathy. REPRODUCTIVE ORGANS: Interval development of a heterogeneous low density mass with some internal foci of air in the region of the central uterus measuring 7.3 x 5.4 x 8.5 cm. BONES AND SOFT TISSUES: Mild diffuse bony wall edema. Chronic T11 compression fracture is unchanged. No acute  fracture or focal osseous lesion. No focal soft tissue abnormality. IMPRESSION: 1. Interval development of a heterogeneous low-density central uterine mass with internal foci of air measuring 7.3 x 5.4 x 8.5 cm; findings are concerning for necrotic malignancy. Recommend gynecology consultation and further evaluation with pelvic ultrasound and/or pelvic MRI with contrast. 2. Numerous new right lower lobe pulmonary nodules measuring up to 14 mm, concerning for metastatic disease; recommend prompt non-contrast chest CT for full evaluation per Fleischner Society Guidelines. 3. Left renal cyst measuring 2 cm, benign with no follow-up imaging recommended. 4. Colonic diverticulosis without evidence of diverticulitis. Electronically signed by: Greig Pique MD MD 07/14/2024 06:52 PM EST RP Workstation: [id number]     Procedures   Medications Ordered in the ED  sodium chloride   0.9 % bolus 500 mL (500 mLs Intravenous New Bag/Given 07/14/24 1820)  iohexol  (OMNIPAQUE ) 300 MG/ML solution 60 mL (60 mLs Intravenous Contrast Given 07/14/24 1825)                                    Medical Decision Making Amount and/or Complexity of Data Reviewed Labs: ordered. Radiology: ordered.  Risk Prescription drug management.   This patient presents to the ED for concern of difficulty walking, this involves an extensive number of treatment options, and is a complaint that carries with it a high risk of complications and morbidity.  I considered the following differential and admission for this acute, potentially life threatening condition.  The differential diagnosis includes dehydration, failure to thrive, UTI, anemia, cerebellar stroke, vertigo, brain mass, intracranial hemorrhage  MDM:    Patient is a 89 year old who presents with general weakness and difficulty walking.  She does not have any focal neurologic deficits but she is not able to ambulate without a two-person assist.  She does not have nystagmus.  Head CT  does not show any acute abnormality.  Labs show anemia which is slightly worse than her prior values.  Mildly elevated creatinine but similar to prior values.  She does not have any cough or cold symptoms concerning for pneumonia.  She did have some abdominal pain.  CT scan was performed which shows a large uterine mass concerning for malignancy with necrosis.  Per family, patient is already seeing an OB/GYN for some brown vaginal discharge.  They are currently getting her cleared for a biopsy.  There is also some concerning nodules in her lungs that may be metastatic disease.  I am concerned that the patient is unable to ambulate.  Discussed admission for further workup and probable MRI.  I cannot rule out a cerebellar stroke or brain mass.  Discussed with family and the patient.  She does not want to stay any longer.  She wants to go home.  They understand the limitations of our current evaluation and that she needs to have close follow-up.  They will call her PCP tomorrow.  She did not want to stay for the urinalysis either.  She was discharged home in good condition.  A referral was put in for heme-onc.  She was advised to follow-up with her primary care doctor regarding the difficulty with her gait as well as her poor perfusion in her legs and let her OB/GYN know about the uterine mass.  Return precautions were given.  (Labs, imaging, consults)  Labs: I Ordered, and personally interpreted labs.  The pertinent results include: Anemia, elevated creatinine, elevated glucose  Imaging Studies ordered: I ordered imaging studies including CT head, CT abdomen pelvis I independently visualized and interpreted imaging. I agree with the radiologist interpretation  Additional history obtained from family at bedside.  External records from outside source obtained and reviewed including prior notes  Cardiac Monitoring: The patient was not maintained on a cardiac monitor.  If on the cardiac monitor, I  personally viewed and interpreted the cardiac monitored which showed an underlying rhythm of:    Reevaluation: After the interventions noted above, I reevaluated the patient and found that they have :stayed the same  Social Determinants of Health:    Disposition: Discharged to home  Co morbidities that complicate the patient evaluation  Past Medical History:  Diagnosis Date   Diabetes mellitus without complication (HCC)  Hypertension    Pulmonary embolism (HCC)    Renal disorder    Stroke (HCC)      Medicines Meds ordered this encounter  Medications   sodium chloride  0.9 % bolus 500 mL   iohexol  (OMNIPAQUE ) 300 MG/ML solution 60 mL    I have reviewed the patients home medicines and have made adjustments as needed  Problem List / ED Course: Problem List Items Addressed This Visit   None Visit Diagnoses       Weakness    -  Primary     Gait disturbance         Uterine mass       Relevant Orders   Ambulatory referral to Hematology / Oncology                Final diagnoses:  Weakness  Gait disturbance  Uterine mass    ED Discharge Orders          Ordered    Ambulatory referral to Hematology / Oncology       Comments: Your emergency department provider has referred you to see a hematology/oncology specialist. These are physicians who specialize in blood disorders and cancers, or findings concerning for cancer. You will receive a phone call from the [facility] Office to set up your appointment within 2 business days: [employer] operate Mon - Fri, 8:00 a.m. to 5:00 p.m.; closed for federally recognized holidays. Please be sure your phone is not set to block numbers during this time.   07/14/24 2114               Lenor Hollering, MD 07/14/24 2135  "

## 2024-07-14 NOTE — Discharge Instructions (Signed)
 You have a large mass in your uterus.  You need to contact your OB/GYN regarding this.  He also need to have close follow-up with your primary care doctor regarding the difficulty walking.  Your pulses were slightly decreased in your lower extremities.  This should also be followed up by your primary care doctor.  Return to the emergency room if you have any worsening symptoms.

## 2024-07-14 NOTE — ED Triage Notes (Addendum)
 Pt POV with daughter in wheelchair. Daughter reports pt is walking a lot slower and is off balance. Pt states hands feel gritty. Has been going on for a couple of weeks. Hx of stroke. No neuro deficits noted. Pt also reports having lower abdominal pain. Denies N/V/D or dizziness.

## 2024-07-18 ENCOUNTER — Telehealth: Payer: Self-pay | Admitting: Oncology

## 2024-07-18 NOTE — Telephone Encounter (Signed)
 Called patient's daughter, Verneita, and asked if they would like to schedule an appointment with Dr. Eldonna with Gyn Oncology.  Verneita said it is really hard to get her mom out of the house now because she is not walking much.  She would like her to see cardiology first to see if her mom would be able to have a biopsy.  Gave her my contact information to call back if she would like to schedule an appointment.

## 2024-07-19 ENCOUNTER — Telehealth: Payer: Self-pay | Admitting: Cardiovascular Disease

## 2024-07-19 NOTE — Telephone Encounter (Signed)
 S/w Verneita (dpr)  The pt has a large mass on uterus. She also has a spot on her lung seen by CT  They/OBGYN wants to do a biopsy on the uterus to see if it is cancerous. They tried to do it in office, but it was too painful for the pt to have this done in office with cream to numb the area. She would need to have anesthesia to get this done.    She just wanted to get Dr Rosslyn opinion on whether they should even go through with the biopsy or not? Would her mom be ok to go through with the biopsy. They would like to know if she has Cancer, but she said that she knows her mother is not strong enough to be able to go through with Chemo and Radiation. But also, the Mass is large and is causing pain.   She just wanted a little guidance, please.  Informed her that I would send this info to her provider and we will call her back. She verbalized understanding.

## 2024-07-19 NOTE — Telephone Encounter (Signed)
 Daughter calling in to speak to the nurse about a biopsy that the patient needs to have done. Please advise

## 2024-07-20 NOTE — Telephone Encounter (Signed)
 Spoke with Amber [facility]. Recommendation given. Stated understanding.

## 2024-08-16 ENCOUNTER — Ambulatory Visit: Admitting: Podiatry

## 2024-09-05 ENCOUNTER — Ambulatory Visit: Admitting: Neurology
